# Patient Record
Sex: Male | Born: 1990 | Race: Black or African American | Hispanic: No | Marital: Married | Smoking: Former smoker
Health system: Southern US, Community
[De-identification: ages and names within clinical notes are randomized; demographics above are authoritative.]

## PROBLEM LIST (undated history)

## (undated) DIAGNOSIS — G43019 Migraine without aura, intractable, without status migrainosus: Secondary | ICD-10-CM

## (undated) DIAGNOSIS — I479 Paroxysmal tachycardia, unspecified: Secondary | ICD-10-CM

## (undated) DIAGNOSIS — G90A Postural orthostatic tachycardia syndrome (POTS): Secondary | ICD-10-CM

## (undated) DIAGNOSIS — F410 Panic disorder [episodic paroxysmal anxiety] without agoraphobia: Secondary | ICD-10-CM

## (undated) DIAGNOSIS — B019 Varicella without complication: Secondary | ICD-10-CM

## (undated) DIAGNOSIS — Z9109 Other allergy status, other than to drugs and biological substances: Secondary | ICD-10-CM

## (undated) HISTORY — DX: Migraine without aura, intractable, without status migrainosus: G43.019

## (undated) HISTORY — DX: Varicella without complication: B01.9

## (undated) HISTORY — DX: Paroxysmal tachycardia, unspecified: I47.9

## (undated) HISTORY — PX: NO PAST SURGERIES: SHX2092

## (undated) HISTORY — DX: Other allergy status, other than to drugs and biological substances: Z91.09

---

## 2016-01-29 ENCOUNTER — Emergency Department (HOSPITAL_COMMUNITY)
Admission: EM | Admit: 2016-01-29 | Discharge: 2016-01-30 | Disposition: A | Discharge status: Home / Self Care | Payer: BLUE CROSS/BLUE SHIELD | Attending: Emergency Medicine | Admitting: Emergency Medicine

## 2016-01-29 ENCOUNTER — Encounter (HOSPITAL_COMMUNITY): Payer: Self-pay

## 2016-01-29 DIAGNOSIS — R519 Headache, unspecified: Secondary | ICD-10-CM

## 2016-01-29 DIAGNOSIS — M542 Cervicalgia: Secondary | ICD-10-CM | POA: Insufficient documentation

## 2016-01-29 DIAGNOSIS — R51 Headache: Secondary | ICD-10-CM | POA: Diagnosis present

## 2016-01-29 DIAGNOSIS — R070 Pain in throat: Secondary | ICD-10-CM | POA: Diagnosis not present

## 2016-01-29 DIAGNOSIS — Z87891 Personal history of nicotine dependence: Secondary | ICD-10-CM | POA: Diagnosis not present

## 2016-01-29 NOTE — ED Notes (Signed)
Pt started getting a headache while at work about an hour ago and was having neck pain before the headache started. Also started having a stinging sensation to his left arm after the headache started. Denies any sensitivity to light or sounds and denies N/V

## 2016-01-29 NOTE — ED Provider Notes (Signed)
CSN: [id number]     Arrival date & time 01/29/16  2028 History   First MD Initiated Contact with Patient 01/29/16 2346     Chief Complaint  Patient presents with  . Headache  . Neck Pain     (Consider location/radiation/quality/duration/timing/severity/associated sxs/prior Treatment) HPI Comments: Is a 25 year old male who states for the past several months he's had left sided anterior neck discomfort, just below the angle of jaw, along with intermittent right-sided headaches.  He states the right side of his face became numb several months ago.  He feels there is pressure behind his right eye.  Denies any visual changes, sore throat, fever, congestion.  He took ibuprofen personally 5 hours ago with no relief of his symptoms  Patient is a 25 y.o. male presenting with headaches and neck pain. The history is provided by the patient.  Headache Pain location:  R temporal Quality:  Dull Radiates to:  Does not radiate Severity currently:  5/10 Onset quality:  Gradual Timing:  Intermittent Chronicity:  Recurrent Similar to prior headaches: yes   Relieved by:  Nothing Worsened by:  Nothing Ineffective treatments:  NSAIDs Associated symptoms: neck pain   Associated symptoms: no dizziness, no fever and no sore throat   Neck Pain Associated symptoms: headaches   Associated symptoms: no fever     History reviewed. No pertinent past medical history. History reviewed. No pertinent past surgical history. No family history on file. Social History  Substance Use Topics  . Smoking status: Former Smoker    Quit date: 05/29/2015  . Smokeless tobacco: None  . Alcohol Use: No    Review of Systems  Constitutional: Negative for fever and chills.  HENT: Negative for sore throat and trouble swallowing.   Eyes: Negative for visual disturbance.  Musculoskeletal: Positive for neck pain.  Neurological: Positive for headaches. Negative for dizziness.  All other systems reviewed and are  negative.     Allergies  Review of patient's allergies indicates no known allergies.  Home Medications   Prior to Admission medications   Not on File   BP 120/71 mmHg  Pulse 64  Temp(Src) 98 F (36.7 C) (Oral)  Resp 18  Ht 6' (1.829 m)  Wt 73.483 kg  BMI 21.97 kg/m2  SpO2 100% Physical Exam  Constitutional: He is oriented to person, place, and time. He appears well-developed and well-nourished.  HENT:  Head: Normocephalic.  Mouth/Throat: No oropharyngeal exudate.  Eyes: Pupils are equal, round, and reactive to light.  Neck: Normal range of motion.  Cardiovascular: Normal rate and regular rhythm.   Pulmonary/Chest: Effort normal and breath sounds normal.  Lymphadenopathy:    He has no cervical adenopathy.  Neurological: He is alert and oriented to person, place, and time. No cranial nerve deficit. Coordination normal.  Skin: Skin is warm and dry.  Nursing note and vitals reviewed.   ED Course  Procedures (including critical care time) Labs Review Labs Reviewed - No data to display  Imaging Review No results found. I have personally reviewed and evaluated these images and lab results as part of my medical decision-making.   EKG Interpretation None     Patient's physical examination is normal without any physical findings to support patient's complaints of facial numbness and throat pain.  He is been given a referral to ENT for further evaluation MDM   Final diagnoses:  Nonintractable headache, unspecified chronicity pattern, unspecified headache type  Throat pain in adult        Earley Favor,  NP 01/30/16 1610  Tomasita Crumble, MD 01/30/16 0530

## 2016-01-30 MED ORDER — KETOROLAC TROMETHAMINE 30 MG/ML IJ SOLN
30.0000 mg | Freq: Once | INTRAMUSCULAR | Status: AC
  Administered 2016-01-30: 30 mg via INTRAMUSCULAR
  Filled 2016-01-30: qty 1

## 2016-01-30 NOTE — Discharge Instructions (Signed)
General Headache Without Cause A headache is pain or discomfort felt around the head or neck area. There are many causes and types of headaches. In some cases, the cause may not be found.  HOME CARE  Managing Pain  Take over-the-counter and prescription medicines only as told by your doctor.  Lie down in a dark, quiet room when you have a headache.  If directed, apply ice to the head and neck area:  Put ice in a plastic bag.  Place a towel between your skin and the bag.  Leave the ice on for 20 minutes, 2-3 times per day.  Use a heating pad or hot shower to apply heat to the head and neck area as told by your doctor.  Keep lights dim if bright lights bother you or make your headaches worse. Eating and Drinking  Eat meals on a regular schedule.  Lessen how much alcohol you drink.  Lessen how much caffeine you drink, or stop drinking caffeine. General Instructions  Keep all follow-up visits as told by your doctor. This is important.  Keep a journal to find out if certain things bring on headaches. For example, write down:  What you eat and drink.  How much sleep you get.  Any change to your diet or medicines.  Relax by getting a massage or doing other relaxing activities.  Lessen stress.  Sit up straight. Do not tighten (tense) your muscles.  Do not use tobacco products. This includes cigarettes, chewing tobacco, or e-cigarettes. If you need help quitting, ask your doctor.  Exercise regularly as told by your doctor.  Get enough sleep. This often means 7-9 hours of sleep. GET HELP IF:  Your symptoms are not helped by medicine.  You have a headache that feels different than the other headaches.  You feel sick to your stomach (nauseous) or you throw up (vomit).  You have a fever. GET HELP RIGHT AWAY IF:   Your headache becomes really bad.  You keep throwing up.  You have a stiff neck.  You have trouble seeing.  You have trouble speaking.  You have  pain in the eye or ear.  Your muscles are weak or you lose muscle control.  You lose your balance or have trouble walking.  You feel like you will pass out (faint) or you pass out.  You have confusion.   This information is not intended to replace advice given to you by your health care provider. Make sure you discuss any questions you have with your health care provider.   Document Released: 09/22/2008 Document Revised: 09/04/2015 Document Reviewed: 04/08/2015 Elsevier Interactive Patient Education [employer] Your physical exam is normal.  You have been given a referral to ENT for further evaluation of your headache, throat pain and facial numbness.  Please call and make an appointment for the next available appointment

## 2016-07-26 ENCOUNTER — Emergency Department (HOSPITAL_COMMUNITY)
Admission: EM | Admit: 2016-07-26 | Discharge: 2016-07-27 | Disposition: A | Discharge status: Home / Self Care | Payer: BLUE CROSS/BLUE SHIELD | Attending: Emergency Medicine | Admitting: Emergency Medicine

## 2016-07-26 ENCOUNTER — Encounter (HOSPITAL_COMMUNITY): Payer: Self-pay | Admitting: Emergency Medicine

## 2016-07-26 ENCOUNTER — Emergency Department (HOSPITAL_COMMUNITY): Payer: BLUE CROSS/BLUE SHIELD

## 2016-07-26 DIAGNOSIS — Z87891 Personal history of nicotine dependence: Secondary | ICD-10-CM | POA: Diagnosis not present

## 2016-07-26 DIAGNOSIS — I313 Pericardial effusion (noninflammatory): Secondary | ICD-10-CM | POA: Diagnosis not present

## 2016-07-26 DIAGNOSIS — R1031 Right lower quadrant pain: Secondary | ICD-10-CM | POA: Diagnosis not present

## 2016-07-26 DIAGNOSIS — I3139 Other pericardial effusion (noninflammatory): Secondary | ICD-10-CM

## 2016-07-26 DIAGNOSIS — R109 Unspecified abdominal pain: Secondary | ICD-10-CM

## 2016-07-26 DIAGNOSIS — R110 Nausea: Secondary | ICD-10-CM | POA: Insufficient documentation

## 2016-07-26 DIAGNOSIS — R1013 Epigastric pain: Secondary | ICD-10-CM | POA: Diagnosis present

## 2016-07-26 HISTORY — DX: Panic disorder (episodic paroxysmal anxiety): F41.0

## 2016-07-26 LAB — URINALYSIS, ROUTINE W REFLEX MICROSCOPIC
Glucose, UA: NEGATIVE mg/dL
HGB URINE DIPSTICK: NEGATIVE
KETONES UR: 40 mg/dL — AB
LEUKOCYTES UA: NEGATIVE
Nitrite: NEGATIVE
PROTEIN: NEGATIVE mg/dL
Specific Gravity, Urine: 1.031 — ABNORMAL HIGH (ref 1.005–1.030)
pH: 6 (ref 5.0–8.0)

## 2016-07-26 LAB — COMPREHENSIVE METABOLIC PANEL
ALT: 20 U/L (ref 17–63)
AST: 24 U/L (ref 15–41)
Albumin: 4.8 g/dL (ref 3.5–5.0)
Alkaline Phosphatase: 47 U/L (ref 38–126)
Anion gap: 10 (ref 5–15)
BILIRUBIN TOTAL: 0.9 mg/dL (ref 0.3–1.2)
BUN: 16 mg/dL (ref 6–20)
CALCIUM: 9.7 mg/dL (ref 8.9–10.3)
CO2: 22 mmol/L (ref 22–32)
CREATININE: 1.3 mg/dL — AB (ref 0.61–1.24)
Chloride: 105 mmol/L (ref 101–111)
Glucose, Bld: 103 mg/dL — ABNORMAL HIGH (ref 65–99)
Potassium: 3.7 mmol/L (ref 3.5–5.1)
Sodium: 137 mmol/L (ref 135–145)
Total Protein: 7.7 g/dL (ref 6.5–8.1)

## 2016-07-26 LAB — CBC
HCT: 44.4 % (ref 39.0–52.0)
Hemoglobin: 15.4 g/dL (ref 13.0–17.0)
MCH: 32.2 pg (ref 26.0–34.0)
MCHC: 34.7 g/dL (ref 30.0–36.0)
MCV: 92.7 fL (ref 78.0–100.0)
PLATELETS: 204 10*3/uL (ref 150–400)
RBC: 4.79 MIL/uL (ref 4.22–5.81)
RDW: 12.9 % (ref 11.5–15.5)
WBC: 4.5 10*3/uL (ref 4.0–10.5)

## 2016-07-26 LAB — LIPASE, BLOOD: LIPASE: 25 U/L (ref 11–51)

## 2016-07-26 NOTE — ED Provider Notes (Signed)
Emergency Department Provider Note   I have reviewed the triage vital signs and the nursing notes.   HISTORY  Chief Complaint Abdominal Pain   HPI Juan Barnes is a 25 y.o. male with PMH of anxiety presents to the ED for evaluation of epigastric and right flank pain since Thursday. He reports some associated diarrhea but denies nausea and vomiting. No prior abdominal surgical history. He has had two urgent care visits since pain onset with no clear diagnosis. The UC today performed a plain film of the abdomen and referred to the ED for further evaluation. He denies fever or chills. No testicular pain or swelling. No change with food intake.   Past Medical History:  Diagnosis Date  . Anxiety attack     There are no active problems to display for this patient.   History reviewed. No pertinent surgical history.  Current Outpatient Rx  . Order #: [id number] Class: Historical Med  . Order #: [id number] Class: Historical Med  . Order #: [id number] Class: Historical Med  . Order #: [id number] Class: Print  . Order #: [id number] Class: Print    Allergies Review of patient's allergies indicates no known allergies.  No family history on file.  Social History Social History  Substance Use Topics  . Smoking status: Former Smoker    Quit date: 05/29/2015  . Smokeless tobacco: Not on file  . Alcohol use No    Review of Systems  Constitutional: No fever/chills Eyes: No visual changes. ENT: No sore throat. Cardiovascular: Denies chest pain. Respiratory: Denies shortness of breath. Gastrointestinal: Epigastric abdominal pain. No nausea, no vomiting.  No diarrhea.  No constipation. Genitourinary: Negative for dysuria. Positive right flank pain. Musculoskeletal: Negative for back pain. Skin: Negative for rash. Neurological: Negative for headaches, focal weakness or numbness.  10-point ROS otherwise negative.  ____________________________________________   PHYSICAL  EXAM:  VITAL SIGNS: ED Triage Vitals  Enc Vitals Group     BP 07/26/16 1939 136/74     Pulse Rate 07/26/16 1939 96     Resp 07/26/16 1939 16     Temp 07/26/16 1939 98.1 F (36.7 C)     Temp Source 07/26/16 1939 Oral     SpO2 07/26/16 1939 100 %     Weight 07/26/16 1939 169 lb (76.7 kg)     Height 07/26/16 1939 6' (1.829 m)     Pain Score 07/26/16 1940 8   Constitutional: Alert and oriented. Well appearing and in no acute distress. Eyes: Conjunctivae are normal. PERRL. EOMI. Head: Atraumatic. Nose: No congestion/rhinnorhea. Mouth/Throat: Mucous membranes are moist.  Oropharynx non-erythematous. Neck: No stridor.  Cardiovascular: Normal rate, regular rhythm. Good peripheral circulation. Grossly normal heart sounds.   Respiratory: Normal respiratory effort.  No retractions. Lungs CTAB. Gastrointestinal: Soft and nontender. Positive tenderness to percussion of the right flank. No distention.  Genitourinary: Normal caliber testicle. No tenderness or masses. Normal cremasteric reflex.  Musculoskeletal: No lower extremity tenderness nor edema. No gross deformities of extremities. Neurologic:  Normal speech and language. No gross focal neurologic deficits are appreciated.  Skin:  Skin is warm, dry and intact. No rash noted. Psychiatric: Mood and affect are normal. Speech and behavior are normal.  ____________________________________________   LABS (all labs ordered are listed, but only abnormal results are displayed)  Labs Reviewed  COMPREHENSIVE METABOLIC PANEL - Abnormal; Notable for the following:       Result Value   Glucose, Bld 103 (*)    Creatinine, Ser 1.30 (*)    All other components  within normal limits  URINALYSIS, ROUTINE W REFLEX MICROSCOPIC (NOT AT [facility]) - Abnormal; Notable for the following:    Specific Gravity, Urine 1.031 (*)    Bilirubin Urine SMALL (*)    Ketones, ur 40 (*)    All other components within normal limits  LIPASE, BLOOD  CBC    ____________________________________________  RADIOLOGY  Ct Renal Stone Study  Result Date: 07/26/2016 CLINICAL DATA:  25 year old male with right flank pain EXAM: CT ABDOMEN AND PELVIS WITHOUT CONTRAST TECHNIQUE: Multidetector CT imaging of the abdomen and pelvis was performed following the standard protocol without IV contrast. COMPARISON:  None. FINDINGS: Evaluation of this exam is limited in the absence of intravenous contrast. The visualized lung bases are clear. Partially visualized pericardial effusion. Correlation with clinical exam and echocardiogram recommended. No intra-abdominal free air or free fluid. The liver, pancreas, spleen, and the adrenal glands appear unremarkable with small amount of layering sludge may be present within the gallbladder. No pericholecystic fluid. The kidneys, visualized ureters, and urinary bladder appear unremarkable. The prostate and seminal vesicles are grossly unremarkable. There is moderate stool throughout the colon. No evidence of bowel obstruction or active inflammation. Normal appendix. The abdominal aorta and IVC are grossly unremarkable on this noncontrast study. No portal venous gas identified. There is no adenopathy. The abdominal wall soft tissues appear unremarkable. The osseous structures are intact. IMPRESSION: No acute intra-abdominal or pelvic pathology. Specifically there is no hydronephrosis or nephrolithiasis. Partially visualized pericardial effusion. Correlation with clinical exam and echocardiogram recommended. Electronically Signed   By: Elgie Collard M.D.   On: 07/26/2016 23:57   ____________________________________________   PROCEDURES  Procedure(s) performed:   Procedures  None ____________________________________________   INITIAL IMPRESSION / ASSESSMENT AND PLAN / ED COURSE  Pertinent labs & imaging results that were available during my care of the patient were reviewed by me and considered in my medical decision  making (see chart for details).  Patient resents to the emergency department for evaluation of right flank and epigastric discomfort with intermittent radiation of pain to the groin. He has absolutely no tenderness to palpation of his anterior abdomen including the right lower quadrant. Extremely low suspicion for appendicitis in this patient based on exam and lab work. Very low suspicion for testicular torsion based on exam and history. No history of kidney stone but given persistent pain symptoms plan for noncontrast CT to further evaluate.   12:09 PM CT scan resulted. No intra-abdominal pathology. Patient is noted to have a pericardial effusion on partial view of the lower heart border with CT. Patient is having no chest pain or dyspnea. Vital signs are unremarkable. Low suspicion clinically is causing his pain with primarily right flank pain radiating to the groin. Discussed the need to call his primary care physician in the morning to schedule an outpatient echocardiogram on an urgent basis.  Given his current clinical picture and normal vital signs and do not feel he needs an emergent study tonight or admission for further evaluation. Patient will call his PCP in the morning and return with any warning signs or symptoms that we discussed in detail.  ____________________________________________  FINAL CLINICAL IMPRESSION(S) / ED DIAGNOSES  Final diagnoses:  Right lower quadrant abdominal pain  Nausea  Pericardial effusion     MEDICATIONS GIVEN DURING THIS VISIT:  None  NEW OUTPATIENT MEDICATIONS STARTED DURING THIS VISIT:  Discharge Medication List as of 07/27/2016 12:13 AM    START taking these medications   Details  dicyclomine (BENTYL) 20  MG tablet Take 1 tablet (20 mg total) by mouth 2 (two) times daily., Starting Mon 07/27/2016, Print    ondansetron (ZOFRAN) 4 MG tablet Take 1 tablet (4 mg total) by mouth every 8 (eight) hours as needed for nausea or vomiting., Starting Mon  07/27/2016, Print        Note:  This document was prepared using Dragon voice recognition software and may include unintentional dictation errors.  Alona Bene, MD Emergency Medicine   Maia Plan, MD 07/27/16 [id number]

## 2016-07-26 NOTE — ED Notes (Signed)
Patient reports RLQ abd pn starting Thursday; went to urgent care who referred him here.  Pain refers to right flank and radiates to groin.  Mild diarrhea today.

## 2016-07-26 NOTE — ED Notes (Signed)
Nurse called to room.  Patient now c/o localized R testicle pain.

## 2016-07-26 NOTE — ED Triage Notes (Signed)
Pt. reports right abdominal pain with mild nausea and diarrhea onset Thursday , seen at an urgent care clinic today advised to go to ER due to appendicitis .

## 2016-07-27 MED ORDER — DICYCLOMINE HCL 20 MG PO TABS: 20.0000 mg | ORAL_TABLET | Freq: Two times a day (BID) | ORAL | 0 refills | Status: DC

## 2016-07-27 MED ORDER — ONDANSETRON HCL 4 MG PO TABS: 4.0000 mg | ORAL_TABLET | Freq: Three times a day (TID) | ORAL | 0 refills | Status: DC | PRN

## 2016-07-27 NOTE — Discharge Instructions (Signed)
You have been seen in the Emergency Department (ED) for abdominal pain.  Your evaluation did not identify a clear cause of your symptoms but was generally reassuring.  We did find some fluid around the heart but no sign that this is causing you any immediate problems. You dod need to contact your PCP later today and schedule an outpatient ECHO cardiogram for better characterization of the fluid.   Please follow up as instructed above regarding today?s emergent visit and the symptoms that are bothering you.  Return to the ED if your abdominal pain worsens or fails to improve, you develop bloody vomiting, bloody diarrhea, you are unable to tolerate fluids due to vomiting, fever greater than 101, or other symptoms that concern you.

## 2016-07-27 NOTE — ED Notes (Signed)
Pt stable, ambulatory, states understanding of discharge instructions 

## 2016-07-28 ENCOUNTER — Encounter: Payer: Self-pay | Admitting: Family Medicine

## 2016-07-28 ENCOUNTER — Ambulatory Visit (INDEPENDENT_AMBULATORY_CARE_PROVIDER_SITE_OTHER): Payer: BLUE CROSS/BLUE SHIELD | Admitting: Family Medicine

## 2016-07-28 VITALS — BP 114/77 | HR 74 | Temp 98.2°F | Resp 20 | Ht 72.0 in | Wt 169.8 lb

## 2016-07-28 DIAGNOSIS — I479 Paroxysmal tachycardia, unspecified: Secondary | ICD-10-CM | POA: Diagnosis not present

## 2016-07-28 DIAGNOSIS — I319 Disease of pericardium, unspecified: Secondary | ICD-10-CM | POA: Diagnosis not present

## 2016-07-28 DIAGNOSIS — I3139 Other pericardial effusion (noninflammatory): Secondary | ICD-10-CM

## 2016-07-28 DIAGNOSIS — F411 Generalized anxiety disorder: Secondary | ICD-10-CM | POA: Diagnosis not present

## 2016-07-28 DIAGNOSIS — R748 Abnormal levels of other serum enzymes: Secondary | ICD-10-CM | POA: Diagnosis not present

## 2016-07-28 DIAGNOSIS — I313 Pericardial effusion (noninflammatory): Secondary | ICD-10-CM | POA: Insufficient documentation

## 2016-07-28 DIAGNOSIS — R7989 Other specified abnormal findings of blood chemistry: Secondary | ICD-10-CM

## 2016-07-28 NOTE — Patient Instructions (Signed)
Pericardial Effusion °Pericardial effusion is a buildup of fluid around your heart. The heart is surrounded by a double-layered sac (pericardium). This sac normally contains a small amount of fluid. When too much builds up, it can put pressure on your heart and cause problems. As fluid builds up in the pericardial sac and pressure on your heart increases, it becomes harder for your heart to pump blood. When fluid prevents your heart from pumping enough blood, it is called cardiac tamponade. Cardiac tamponade is a life-threatening condition. °CAUSES  °Often the cause of pericardial effusion is not known (idiopathic effusion). Possible causes are from: °· Infections, such as from a virus, bacteria, fungus, or parasite. °· Damage to the pericardium from heart surgery or a heart attack. °· Inflammatory diseases, such as rheumatoid arthritis or lupus. °· Kidney disease. °· Thyroid disease. °· Cancer. °· Cancer treatment, including radiation or chemotherapy. °· Certain drugs, including tuberculosis drugs or seizure drugs. °· Chest trauma. °SIGNS AND SYMPTOMS  °Pericardial effusion may not cause symptoms at first, especially if the fluid builds up slowly. In time, pressure on the heart may cause: °· Chest pain. °· Trouble breathing. °· Pain and shortness of breath that is worse when lying down. °· Dizziness. °· Fainting. °· Cough. °· Hiccups. °· Skipped heartbeats (palpitations). °· Anxiety and confusion. °· A bluish skin color (cyanosis). °· Swollen legs and ankles. °DIAGNOSIS  °Your health care provider may suspect pericardial effusion based on your symptoms. Your health care provider may also do a physical exam to check for: °· Low blood pressure and weak pulses. °· Soft (muffled) heart sounds. °· Rapid heartbeat. °· Full veins in your neck (distended jugular veins). °· Decreased breathing sounds and a rubbing sound (friction rub) when listening to your lungs. °Your health care provider may also do several tests to  confirm the diagnosis and find out what is causing the pericardial effusion. These may include: °· Chest X-ray. °· Imaging study of the heart using sound waves (echocardiogram). °· CT scan or MRI. °· Electrical study of the heart (electrocardiogram [ECG]). °· A procedure using a needle to remove fluid from the pericardium for examination (pericardiocentesis). °· Blood tests to check for: °¨ Infection. °¨ Heart damage. °¨ Thyroid abnormalities. °¨ Kidney disease. °¨ Inflammatory disorders. °TREATMENT  °Treatment for pericardial effusion depends on the cause of your symptoms and how severe your symptoms are. If a specific cause was found, that cause will be treated. Treatment may include: °· Medicines, such as: °¨ Nonsteroidal anti-inflammatory drugs (NSAIDs). °¨ Other anti-inflammatory drugs, such as steroids. °¨ Antibiotic medicine. °¨ Antifungal medicine. °· Hospital treatment may be necessary, such as for cardiac tamponade. This may include: °¨ Intravenous (IV) fluids. °¨ Breathing support. °· Surgery may be needed in severe cases. Surgery may include: °¨ Pericardiocentesis. °¨ Open heart surgery. °¨ A procedure to make a permanent opening in the pericardium (pericardial window). °SEEK MEDICAL CARE IF: °· You feel dizzy or light-headed. °· You have swelling in your legs or ankles. °· You have heart palpitations. °· You have persistent cough or hiccups. °SEEK IMMEDIATE MEDICAL CARE IF:  °· You faint. °· You have chest pain. °· You have trouble breathing. °These symptoms may represent a serious problem that is an emergency. Do not wait to see if the symptoms will go away. Get medical help right away. Call your local emergency services (911 in the [place] Do not drive yourself to the hospital. °MAKE SURE YOU: °· Understand these instructions. °· Will watch your condition. °·   Will get help right away if you are not doing well or get worse. °  °This information is not intended to replace advice given to you by your  health care provider. Make sure you discuss any questions you have with your health care provider. °  °Document Released: 08/11/2005 Document Revised: 01/04/2015 Document Reviewed: 05/03/2014 °Elsevier Interactive Patient Education ©2016 [employer] ° °

## 2016-07-28 NOTE — Progress Notes (Signed)
Patient ID: Juan Barnes, male  DOB: [birth year], 25 y.o.   MRN: [id number] Patient Care Team    Relationship Specialty Notifications Start End  Ma Hillock, DO PCP - General Family Medicine  07/28/16     Subjective:  Juan Barnes is a 25 y.o.  male present for new patient establishment with recent ED visit.  All past medical history, surgical history, allergies, family history, immunizations, medications and social history were obtained/entered in the electronic medical record today. All recent labs, ED visits and hospitalizations within the last year were reviewed.  Pericardial effusion: Pt presented to ED 2 days ago with epigastric Right quadrant pain. CBC, CMP and lipase WNL, with the exception of mildly elevated creatinine and ketones in his urine.  CT scan of abd was completed with pericardial effusion present (partial view only). Patient has a history of anxiety and tachycardia. He was seen last year at outside facility for tachycardia and referred to cardiology, which he did not end up going because all symptoms resolved with anxiety medication. He denies chest pain, shortness breath, fatigue, night sweats. He has some weight loss after GI symptoms. He plays tennis a few times a week without difficulties.   Health maintenance:  Colonoscopy: No Fhx, screen 50 Immunizations:unknown  Infectious disease screening: HIV unknown.  PSA: No results found for: PSA. FHX in father at 62.   There is no immunization history on file for this patient.   Past Medical History:  Diagnosis Date  . Anxiety attack   . Chicken pox   . Pollen allergy   . Tachycardia, paroxysmal (HCC)    Allergies  Allergen Reactions  . Banana Itching    Itchy/tight throat.    History reviewed. No pertinent surgical history. Family History  Problem Relation Age of Onset  . Prostate cancer Father   . Post-traumatic stress disorder Father    Social History   Social History  . Marital status: Single      Spouse name: N/A  . Number of children: N/A  . Years of education: N/A   Occupational History  . Not on file.   Social History Main Topics  . Smoking status: Former Smoker    Types: Cigarettes    Quit date: 05/29/2015  . Smokeless tobacco: Never Used  . Alcohol use No  . Drug use:     Types: Marijuana  . Sexual activity: Yes    Partners: Female    Birth control/ protection: None   Other Topics Concern  . Not on file   Social History Narrative   Engaged to [place]. ( 1 child Camilla).    Some college. Distribution Specialist.    Takes a daily vitamin.    Wears seatbelt   Exercise routinely.    Smoke detector in the home.    Firearms (locked) in the home.    Feels safe in relationships.      Medication List       Accurate as of 07/28/16  3:14 PM. Always use your most recent med list.          clonazePAM 0.5 MG tablet Commonly known as:  KLONOPIN Take 0.5 mg by mouth 3 (three) times daily as needed for anxiety.   dicyclomine 20 MG tablet Commonly known as:  BENTYL Take 1 tablet (20 mg total) by mouth 2 (two) times daily.   esomeprazole 40 MG capsule Commonly known as:  NEXIUM Take 40 mg by mouth daily at 12 noon.  MAALOX ADVANCED 200-200-20 MG/5ML suspension Generic drug:  alum & mag hydroxide-simeth Take 10 mLs by mouth 2 (two) times daily as needed for indigestion or heartburn.   ondansetron 4 MG tablet Commonly known as:  ZOFRAN Take 1 tablet (4 mg total) by mouth every 8 (eight) hours as needed for nausea or vomiting.        Recent Results (from the past 2160 hour(s))  Urinalysis, Routine w reflex microscopic     Status: Abnormal   Collection Time: 07/26/16  1:41 PM  Result Value Ref Range   Color, Urine YELLOW YELLOW   APPearance CLEAR CLEAR   Specific Gravity, Urine 1.031 (H) 1.005 - 1.030   pH 6.0 5.0 - 8.0   Glucose, UA NEGATIVE NEGATIVE mg/dL   Hgb urine dipstick NEGATIVE NEGATIVE   Bilirubin Urine SMALL (A) NEGATIVE   Ketones, ur 40  (A) NEGATIVE mg/dL   Protein, ur NEGATIVE NEGATIVE mg/dL   Nitrite NEGATIVE NEGATIVE   Leukocytes, UA NEGATIVE NEGATIVE    Comment: MICROSCOPIC NOT DONE ON URINES WITH NEGATIVE PROTEIN, BLOOD, LEUKOCYTES, NITRITE, OR GLUCOSE <1000 mg/dL.  Lipase, blood     Status: None   Collection Time: 07/26/16  7:47 PM  Result Value Ref Range   Lipase 25 11 - 51 U/L  Comprehensive metabolic panel     Status: Abnormal   Collection Time: 07/26/16  7:47 PM  Result Value Ref Range   Sodium 137 135 - 145 mmol/L   Potassium 3.7 3.5 - 5.1 mmol/L   Chloride 105 101 - 111 mmol/L   CO2 22 22 - 32 mmol/L   Glucose, Bld 103 (H) 65 - 99 mg/dL   BUN 16 6 - 20 mg/dL   Creatinine, Ser 1.30 (H) 0.61 - 1.24 mg/dL   Calcium 9.7 8.9 - 10.3 mg/dL   Total Protein 7.7 6.5 - 8.1 g/dL   Albumin 4.8 3.5 - 5.0 g/dL   AST 24 15 - 41 U/L   ALT 20 17 - 63 U/L   Alkaline Phosphatase 47 38 - 126 U/L   Total Bilirubin 0.9 0.3 - 1.2 mg/dL   GFR calc non Af Amer >60 >60 mL/min   GFR calc Af Amer >60 >60 mL/min    Comment: (NOTE) The eGFR has been calculated using the CKD EPI equation. This calculation has not been validated in all clinical situations. eGFR's persistently <60 mL/min signify possible Chronic Kidney Disease.    Anion gap 10 5 - 15  CBC     Status: None   Collection Time: 07/26/16  7:47 PM  Result Value Ref Range   WBC 4.5 4.0 - 10.5 K/uL   RBC 4.79 4.22 - 5.81 MIL/uL   Hemoglobin 15.4 13.0 - 17.0 g/dL   HCT 44.4 39.0 - 52.0 %   MCV 92.7 78.0 - 100.0 fL   MCH 32.2 26.0 - 34.0 pg   MCHC 34.7 30.0 - 36.0 g/dL   RDW 12.9 11.5 - 15.5 %   Platelets 204 150 - 400 K/uL    Ct Renal Stone Study  Result Date: 07/26/2016 CLINICAL DATA:  25 year old male with right flank pain EXAM: CT ABDOMEN AND PELVIS WITHOUT CONTRAST TECHNIQUE: Multidetector CT imaging of the abdomen and pelvis was performed following the standard protocol without IV contrast. COMPARISON:  None. FINDINGS: Evaluation of this exam is limited in  the absence of intravenous contrast. The visualized lung bases are clear. Partially visualized pericardial effusion. Correlation with clinical exam and echocardiogram recommended. No intra-abdominal free air  or free fluid. The liver, pancreas, spleen, and the adrenal glands appear unremarkable with small amount of layering sludge may be present within the gallbladder. No pericholecystic fluid. The kidneys, visualized ureters, and urinary bladder appear unremarkable. The prostate and seminal vesicles are grossly unremarkable. There is moderate stool throughout the colon. No evidence of bowel obstruction or active inflammation. Normal appendix. The abdominal aorta and IVC are grossly unremarkable on this noncontrast study. No portal venous gas identified. There is no adenopathy. The abdominal wall soft tissues appear unremarkable. The osseous structures are intact. IMPRESSION: No acute intra-abdominal or pelvic pathology. Specifically there is no hydronephrosis or nephrolithiasis. Partially visualized pericardial effusion. Correlation with clinical exam and echocardiogram recommended. Electronically Signed   By: Anner Crete M.D.   On: 07/26/2016 23:57  ROS: 14 pt review of systems performed and negative (unless mentioned in an HPI)  Objective: BP 114/77 (BP Location: Left Arm, Patient Position: Sitting, Cuff Size: Large)   Pulse 74   Temp 98.2 F (36.8 C) (Oral)   Resp 20   Ht 6' (1.829 m)   Wt 169 lb 12 oz (77 kg)   SpO2 100%   BMI 23.02 kg/m  Gen: Afebrile. No acute distress. Nontoxic in appearance, well-developed, well-nourished,  Pleasant AAM. HENT: AT. [place].  MMM, no oral lesions.  no Cough on exam, no hoarseness on exam. Eyes:Pupils Equal Round Reactive to light, Extraocular movements intact,  Conjunctiva without redness, discharge or icterus. Neck/lymp/endocrine: Supple,no lymphadenopathy, no thyromegaly CV: RRR no murmur, no edema, +2/4 P posterior tibialis pulses. No TTP chest wall.   Chest: CTAB, no wheeze, rhonchi or crackles. normal Respiratory effort. good Air movement. Abd: Soft. flat. NTND. BS present. No  Masses palpated. No hepatosplenomegaly. No rebound tenderness or guarding. Skin:Warm and well-perfused. Skin intact. Neuro/Msk: Normal gait. PERLA. EOMi. Alert. Oriented x3.  Psych: Normal affect, dress and demeanor. Normal speech. Normal thought content and judgment. EKG: HR 69, PR 158, Qtc 379, repolarization variant. Otherwise no ST or T wave changes.   Assessment/plan: Juan Barnes is a 25 y.o. male present for new  Pt establishment, ED follow up.  Pericardial effusion/Tachycardia, paroxysmal (HCC) - EKG 12-Lead (above) - ECHOCARDIOGRAM COMPLETE; Future - TSH - Sed Rate (ESR) - C-reactive protein  Generalized anxiety disorder Currently prescribed Klonopin. Does well on 0.5 mg BID PRN.  - No refills today.    Elevated serum creatinine - BASIC METABOLIC PANEL WITH GFR - mildly elevated in the ED. Repeat today.    Greater than 45 minutes was spent with patient, greater than 50% of that time was spent face-to-face with patient counseling and coordinating care.   CPE within 3 months.    Electronically signed by: Howard Pouch, DO [place]

## 2016-07-29 ENCOUNTER — Telehealth: Payer: Self-pay | Admitting: Family Medicine

## 2016-07-29 LAB — BASIC METABOLIC PANEL WITH GFR
BUN: 22 mg/dL (ref 7–25)
CALCIUM: 9.8 mg/dL (ref 8.6–10.3)
CO2: 26 mmol/L (ref 20–31)
Chloride: 102 mmol/L (ref 98–110)
Creat: 1.28 mg/dL (ref 0.60–1.35)
GFR, EST AFRICAN AMERICAN: 88 mL/min (ref >=60)
GFR, Est Non African American: 76 mL/min (ref >=60)
GLUCOSE: 89 mg/dL (ref 65–99)
POTASSIUM: 4.6 mmol/L (ref 3.5–5.3)
Sodium: 139 mmol/L (ref 135–146)

## 2016-07-29 LAB — TSH: TSH: 1.9 u[IU]/mL (ref 0.35–4.50)

## 2016-07-29 LAB — SEDIMENTATION RATE: Sed Rate: 1 mm/hr (ref 0–15)

## 2016-07-29 LAB — C-REACTIVE PROTEIN: CRP: <0.1 mg/dL — ABNORMAL LOW (ref 0.5–20.0)

## 2016-07-29 NOTE — Telephone Encounter (Signed)
Please call pt: - all of his labs are normal.

## 2016-07-30 ENCOUNTER — Other Ambulatory Visit: Payer: Self-pay

## 2016-07-30 ENCOUNTER — Ambulatory Visit (HOSPITAL_COMMUNITY): Payer: BLUE CROSS/BLUE SHIELD | Attending: Cardiology

## 2016-07-30 DIAGNOSIS — I3139 Other pericardial effusion (noninflammatory): Secondary | ICD-10-CM

## 2016-07-30 DIAGNOSIS — I313 Pericardial effusion (noninflammatory): Secondary | ICD-10-CM | POA: Diagnosis present

## 2016-07-30 DIAGNOSIS — I319 Disease of pericardium, unspecified: Secondary | ICD-10-CM | POA: Diagnosis not present

## 2016-07-30 DIAGNOSIS — I479 Paroxysmal tachycardia, unspecified: Secondary | ICD-10-CM | POA: Insufficient documentation

## 2016-07-30 DIAGNOSIS — Z87891 Personal history of nicotine dependence: Secondary | ICD-10-CM | POA: Insufficient documentation

## 2016-07-30 NOTE — Telephone Encounter (Signed)
Spoke with patient reviewed lab results. 

## 2016-07-31 ENCOUNTER — Telehealth: Payer: Self-pay | Admitting: Family Medicine

## 2016-07-31 DIAGNOSIS — I313 Pericardial effusion (noninflammatory): Secondary | ICD-10-CM

## 2016-07-31 DIAGNOSIS — I3139 Other pericardial effusion (noninflammatory): Secondary | ICD-10-CM

## 2016-07-31 NOTE — Telephone Encounter (Signed)
Please call pt: - his echocardiogram showed a very small "trivial" pericardial effusion (this was completely explained during his visit).  - I have placed an order for CXR ([facility]), and a few additional labs to have completed over the next two weeks at his convenience, with a follow up with this provider 2-3 days after. He should be scheduled for the week of 21 st with provider.Marland Kitchen

## 2016-07-31 NOTE — Telephone Encounter (Signed)
Pt advised and voiced understanding.   

## 2016-08-04 ENCOUNTER — Encounter: Payer: Self-pay | Admitting: Family Medicine

## 2016-08-04 ENCOUNTER — Ambulatory Visit (HOSPITAL_BASED_OUTPATIENT_CLINIC_OR_DEPARTMENT_OTHER)
Admission: RE | Admit: 2016-08-04 | Discharge: 2016-08-04 | Disposition: A | Discharge status: Home / Self Care | Payer: BLUE CROSS/BLUE SHIELD | Source: Ambulatory Visit | Attending: Family Medicine | Admitting: Family Medicine

## 2016-08-04 ENCOUNTER — Ambulatory Visit (INDEPENDENT_AMBULATORY_CARE_PROVIDER_SITE_OTHER): Payer: BLUE CROSS/BLUE SHIELD | Admitting: Family Medicine

## 2016-08-04 VITALS — BP 125/84 | HR 73 | Temp 98.0°F | Resp 20 | Ht 72.0 in | Wt 170.0 lb

## 2016-08-04 DIAGNOSIS — I3139 Other pericardial effusion (noninflammatory): Secondary | ICD-10-CM

## 2016-08-04 DIAGNOSIS — H6123 Impacted cerumen, bilateral: Secondary | ICD-10-CM

## 2016-08-04 DIAGNOSIS — I313 Pericardial effusion (noninflammatory): Secondary | ICD-10-CM

## 2016-08-04 DIAGNOSIS — I319 Disease of pericardium, unspecified: Secondary | ICD-10-CM

## 2016-08-04 DIAGNOSIS — Z0000 Encounter for general adult medical examination without abnormal findings: Secondary | ICD-10-CM | POA: Diagnosis not present

## 2016-08-04 MED ORDER — CARBAMIDE PEROXIDE 6.5 % OT SOLN: 5.0000 [drp] | Freq: Two times a day (BID) | OTIC | 1 refills | Status: DC

## 2016-08-04 NOTE — Progress Notes (Signed)
Patient ID: Juan Barnes, male  DOB: November 08, 1991, 25 y.o.   MRN: [id number] Patient Care Team    Relationship Specialty Notifications Start End  Ma Hillock, DO PCP - General Family Medicine  07/28/16     Subjective:  Juan Barnes is a 25 y.o. male present for CPE. All past medical history, surgical history, allergies, family history, immunizations, medications and social history were updated in the electronic medical record today. All recent labs, ED visits and hospitalizations within the last year were reviewed.  Health maintenance:  Colonoscopy: colon cancer in [place], at age 57. Immunizations:  tdap 2015, influenza yearly encouraged.   Infectious disease screening: HIV completed today PSA: No results found for: PSA, pt was counseled on prostate cancer screenings. Fhx in father, would screen him at 30-35.  Assistive device: None  Oxygen BWL:SLHT  Patient has a Dental home. Hospitalizations/ED visits: reviewed.   Depression screen PHQ 2/9 07/28/2016  Decreased Interest 0  Down, Depressed, Hopeless 0  PHQ - 2 Score 0   Fall Risk  07/28/2016  Falls in the past year? No     Immunization History  Administered Date(s) Administered  . Tdap 06/30/2014     Past Medical History:  Diagnosis Date  . Anxiety attack   . Chicken pox   . Pollen allergy   . Tachycardia, paroxysmal (HCC)    Allergies  Allergen Reactions  . Banana Itching    Itchy/tight throat.    No past surgical history on file. Family History  Problem Relation Age of Onset  . Prostate cancer Father     37  . Post-traumatic stress disorder Father    Social History   Social History  . Marital status: Single    Spouse name: N/A  . Number of children: N/A  . Years of education: N/A   Occupational History  . Not on file.   Social History Main Topics  . Smoking status: Former Smoker    Types: Cigarettes    Quit date: 05/29/2015  . Smokeless tobacco: Never Used  . Alcohol use No  . Drug use:    Types: Marijuana  . Sexual activity: Yes    Partners: Female    Birth control/ protection: None   Other Topics Concern  . Not on file   Social History Narrative   Engaged to Juan Barnes. ( 1 child Juan Barnes).    Some college. Distribution Specialist.    Takes a daily vitamin.    Wears seatbelt   Exercise routinely.    Smoke detector in the home.    Firearms (locked) in the home.    Feels safe in relationships.      Medication List       Accurate as of 08/04/16  2:09 PM. Always use your most recent med list.          carbamide peroxide 6.5 % otic solution Commonly known as:  DEBROX Place 5 drops into both ears 2 (two) times daily.   clonazePAM 0.5 MG tablet Commonly known as:  KLONOPIN Take 0.5 mg by mouth 3 (three) times daily as needed for anxiety.   dicyclomine 20 MG tablet Commonly known as:  BENTYL Take 1 tablet (20 mg total) by mouth 2 (two) times daily.   esomeprazole 40 MG capsule Commonly known as:  NEXIUM Take 40 mg by mouth daily at 12 noon.   MAALOX ADVANCED 200-200-20 MG/5ML suspension Generic drug:  alum & mag hydroxide-simeth Take 10 mLs by mouth 2 (two) times  daily as needed for indigestion or heartburn.   ondansetron 4 MG tablet Commonly known as:  ZOFRAN Take 1 tablet (4 mg total) by mouth every 8 (eight) hours as needed for nausea or vomiting.        Recent Results (from the past 2160 hour(s))  Urinalysis, Routine w reflex microscopic     Status: Abnormal   Collection Time: 07/26/16  1:41 PM  Result Value Ref Range   Color, Urine YELLOW YELLOW   APPearance CLEAR CLEAR   Specific Gravity, Urine 1.031 (H) 1.005 - 1.030   pH 6.0 5.0 - 8.0   Glucose, UA NEGATIVE NEGATIVE mg/dL   Hgb urine dipstick NEGATIVE NEGATIVE   Bilirubin Urine SMALL (A) NEGATIVE   Ketones, ur 40 (A) NEGATIVE mg/dL   Protein, ur NEGATIVE NEGATIVE mg/dL   Nitrite NEGATIVE NEGATIVE   Leukocytes, UA NEGATIVE NEGATIVE    Comment: MICROSCOPIC NOT DONE ON URINES WITH NEGATIVE  PROTEIN, BLOOD, LEUKOCYTES, NITRITE, OR GLUCOSE <1000 mg/dL.  Lipase, blood     Status: None   Collection Time: 07/26/16  7:47 PM  Result Value Ref Range   Lipase 25 11 - 51 U/L  Comprehensive metabolic panel     Status: Abnormal   Collection Time: 07/26/16  7:47 PM  Result Value Ref Range   Sodium 137 135 - 145 mmol/L   Potassium 3.7 3.5 - 5.1 mmol/L   Chloride 105 101 - 111 mmol/L   CO2 22 22 - 32 mmol/L   Glucose, Bld 103 (H) 65 - 99 mg/dL   BUN 16 6 - 20 mg/dL   Creatinine, Ser 1.30 (H) 0.61 - 1.24 mg/dL   Calcium 9.7 8.9 - 10.3 mg/dL   Total Protein 7.7 6.5 - 8.1 g/dL   Albumin 4.8 3.5 - 5.0 g/dL   AST 24 15 - 41 U/L   ALT 20 17 - 63 U/L   Alkaline Phosphatase 47 38 - 126 U/L   Total Bilirubin 0.9 0.3 - 1.2 mg/dL   GFR calc non Af Amer >60 >60 mL/min   GFR calc Af Amer >60 >60 mL/min    Comment: (NOTE) The eGFR has been calculated using the CKD EPI equation. This calculation has not been validated in all clinical situations. eGFR's persistently <60 mL/min signify possible Chronic Kidney Disease.    Anion gap 10 5 - 15  CBC     Status: None   Collection Time: 07/26/16  7:47 PM  Result Value Ref Range   WBC 4.5 4.0 - 10.5 K/uL   RBC 4.79 4.22 - 5.81 MIL/uL   Hemoglobin 15.4 13.0 - 17.0 g/dL   HCT 44.4 39.0 - 52.0 %   MCV 92.7 78.0 - 100.0 fL   MCH 32.2 26.0 - 34.0 pg   MCHC 34.7 30.0 - 36.0 g/dL   RDW 12.9 11.5 - 15.5 %   Platelets 204 150 - 400 K/uL  BASIC METABOLIC PANEL WITH GFR     Status: None   Collection Time: 07/28/16  3:08 PM  Result Value Ref Range   Sodium 139 135 - 146 mmol/L   Potassium 4.6 3.5 - 5.3 mmol/L   Chloride 102 98 - 110 mmol/L   CO2 26 20 - 31 mmol/L   Glucose, Bld 89 65 - 99 mg/dL   BUN 22 7 - 25 mg/dL   Creat 1.28 0.60 - 1.35 mg/dL   Calcium 9.8 8.6 - 10.3 mg/dL   GFR, Est African American 88 >=60 mL/min   GFR, Est  Non African American 76 >=60 mL/min  TSH     Status: None   Collection Time: 07/28/16  3:08 PM  Result Value Ref  Range   TSH 1.90 0.35 - 4.50 uIU/mL  Sed Rate (ESR)     Status: None   Collection Time: 07/28/16  3:08 PM  Result Value Ref Range   Sed Rate 1 0 - 15 mm/hr  C-reactive protein     Status: Abnormal   Collection Time: 07/28/16  3:08 PM  Result Value Ref Range   CRP <0.1 (L) 0.5 - 20.0 mg/dL    Dg Chest 2 View  Result Date: 08/04/2016 CLINICAL DATA:  Pericardial effusion. EXAM: CHEST  2 VIEW COMPARISON:  No recent. FINDINGS: Mediastinum hilar structures normal. Heart size normal. No focal pulmonary infiltrate . No pleural effusion or pneumothorax . IMPRESSION: No acute cardiopulmonary disease. Electronically Signed   By: Marcello Moores  Register   On: 08/04/2016 13:17     ROS: 14 pt review of systems performed and negative (unless mentioned in an HPI)  Objective: BP 125/84 (BP Location: Left Arm, Patient Position: Sitting, Cuff Size: Large)   Pulse 73   Temp 98 F (36.7 C) (Oral)   Resp 20   Ht 6' (1.829 m)   Wt 170 lb (77.1 kg)   SpO2 98%   BMI 23.06 kg/m  Gen: Afebrile. No acute distress. Nontoxic in appearance, well-developed, well-nourished,  AAM. Very pleasant, physically fit.  HENT: AT. [place]. Bilateral TM visualized and normal in appearance, normal external auditory canal. MMM, no oral lesions, adequate dentition. Bilateral nares within normal limits. Throat without erythema, ulcerations or exudates. no Cough on exam, no hoarseness on exam. Eyes:Pupils Equal Round Reactive to light, Extraocular movements intact,  Conjunctiva without redness, discharge or icterus. Neck/lymp/endocrine: Supple,no lymphadenopathy, no thyromegaly CV: RRR no murmur, no edema, +2/4 P posterior tibialis pulses.  Chest: CTAB, no wheeze, rhonchi or crackles. normal Respiratory effort. good Air movement. Abd: Soft. flat. NTND. BS present. no Masses palpated. No hepatosplenomegaly. No rebound tenderness or guarding. Skin: no rashes, purpura or petechiae. Warm and well-perfused. Skin intact. Neuro/Msk:  Normal  gait. PERLA. EOMi. Alert. Oriented x3.  Cranial nerves II through XII intact. Muscle strength 5/5 upper/lower extremity. DTRs equal bilaterally. Psych: Normal affect, dress and demeanor. Normal speech. Normal thought content and judgment.   Assessment/plan: Juan Barnes is a 25 y.o. male present for CPE. Encounter for preventive health examination Patient was encouraged to exercise greater than 150 minutes a week. Patient was encouraged to choose a diet filled with fresh fruits and vegetables, and lean meats. - dicussed early screening for prostate and colon for him, secondary to family history. Explained alarm symptoms/early signs of prostate and colon cancers.  - UTD immunizations. Yearly influenza encouraged.  - HIV completed today  Bilateral impacted cerumen - discussed acute use for current impaction as indicated on the bottle, then use about once a month to maintain since he produces increased wax.  - carbamide peroxide (DEBROX) 6.5 % otic solution; Place 5 drops into both ears 2 (two) times daily.  Dispense: 15 mL; Refill: 1  Pericardial effusion - Reviewed all current labs, which are normal (see above).  - reviewed CXR and ECHO results. All normal, with the exception of "trivial pericardial effusion". - will follow in 6 months to make certain not enlarging. If remains the same or smaller, he remains asymtpomatic and labs normal, would not see indication to follow after repeat.  - monitor for any development of  symptoms (CP, SOB etc).   AVS provided to patient today for education/recommendation on gender specific health and safety maintenance.  Return in about 6 months (around 02/04/2017), or pericardial effusion.  Electronically signed by: Howard Pouch, DO [place]

## 2016-08-04 NOTE — Patient Instructions (Signed)

## 2016-08-05 ENCOUNTER — Telehealth: Payer: Self-pay | Admitting: Family Medicine

## 2016-08-05 DIAGNOSIS — I313 Pericardial effusion (noninflammatory): Secondary | ICD-10-CM

## 2016-08-05 DIAGNOSIS — I3139 Other pericardial effusion (noninflammatory): Secondary | ICD-10-CM

## 2016-08-05 DIAGNOSIS — R768 Other specified abnormal immunological findings in serum: Secondary | ICD-10-CM

## 2016-08-05 LAB — HIV ANTIBODY (ROUTINE TESTING W REFLEX): HIV 1&2 Ab, 4th Generation: NONREACTIVE

## 2016-08-05 LAB — ANTI-NUCLEAR AB-TITER (ANA TITER)

## 2016-08-05 LAB — QUANTIFERON TB GOLD ASSAY (BLOOD)
Interferon Gamma Release Assay: NEGATIVE
QUANTIFERON TB AG MINUS NIL: 0 [IU]/mL
Quantiferon Nil Value: 0.07 IU/mL

## 2016-08-05 LAB — RPR

## 2016-08-05 LAB — ANA: Anti Nuclear Antibody(ANA): POSITIVE — AB

## 2016-08-05 NOTE — Telephone Encounter (Signed)
Please call patient: - His autoimmune screen (ANA) is positive. Remind him this does not necessarily mean he has autoimmune disease, this means that we need to do additional testing to make sure that he does not have lupus causing the inflammation around his heart. All of his other labs have returned normal. I have placed additional labs for him to have collected next week on him out, if any of those return positive, then we will need to see each other via an appointment. If this returned negative we will call him with results.  Labs are placed for future orders.

## 2016-08-06 ENCOUNTER — Other Ambulatory Visit: Payer: BLUE CROSS/BLUE SHIELD

## 2016-08-06 DIAGNOSIS — I313 Pericardial effusion (noninflammatory): Secondary | ICD-10-CM

## 2016-08-06 DIAGNOSIS — R768 Other specified abnormal immunological findings in serum: Secondary | ICD-10-CM

## 2016-08-06 DIAGNOSIS — I3139 Other pericardial effusion (noninflammatory): Secondary | ICD-10-CM

## 2016-08-06 LAB — LACTATE DEHYDROGENASE, ISOENZYMES
LD1/LD2 Ratio: 0.74
LDH 1: 21 % (ref 19–38)
LDH 2: 29 % — ABNORMAL LOW (ref 30–43)
LDH 3: 23 % (ref 16–26)
LDH 4: 12 % (ref 3–12)
LDH 5: 15 % — ABNORMAL HIGH (ref 3–14)
LDH ISOENZYMES, TOTAL: 161 U/L (ref 100–220)

## 2016-08-06 NOTE — Telephone Encounter (Signed)
Spoke with patient reviewed lab results and instuctions. Scheduled lab appt.

## 2016-08-06 NOTE — Telephone Encounter (Signed)
Left message for patient to return call to review lab results. 

## 2016-08-07 LAB — ANTI-SMITH ANTIBODY: ENA SM AB SER-ACNC: NEGATIVE

## 2016-08-07 LAB — ANTI-DNA ANTIBODY, DOUBLE-STRANDED: ds DNA Ab: <1 IU/mL

## 2016-08-08 LAB — HISTONE ANTIBODIES, IGG, BLOOD

## 2016-08-10 LAB — RFLX HEXAGONAL PHASE CONFIRM: HEXAGONAL PHASE CONFIRM: NEGATIVE

## 2016-08-10 LAB — RFX PTT-LA W/RFX TO HEX PHASE CONF: PTT-LA Screen: 48 s — ABNORMAL HIGH (ref <=40)

## 2016-08-10 LAB — RFX DRVVT SCR W/RFLX CONF 1:1 MIX: DRVVT SCREEN: 36 s (ref <=45)

## 2016-08-10 LAB — LUPUS ANTICOAGULANT PANEL

## 2016-08-11 ENCOUNTER — Other Ambulatory Visit: Payer: BLUE CROSS/BLUE SHIELD

## 2016-08-18 ENCOUNTER — Telehealth: Payer: Self-pay | Admitting: Family Medicine

## 2016-08-18 NOTE — Telephone Encounter (Signed)
Please call pt: - his additional labs are negative/normal. - F/U  6 months

## 2016-08-18 NOTE — Telephone Encounter (Signed)
Reviewed lab results and instructions for follow up with patient.

## 2016-08-25 ENCOUNTER — Telehealth: Payer: Self-pay | Admitting: Family Medicine

## 2016-08-25 NOTE — Telephone Encounter (Signed)
Left message on patient voice mail he will need to schedule an appt for evaluation.

## 2016-08-25 NOTE — Telephone Encounter (Signed)
Pt calling for a refill on his clonazepam. Ok to lm on vm when ready for pick up.

## 2016-08-25 NOTE — Telephone Encounter (Signed)
Pt will need to have an appt to discuss the reasoning use of his clonazepam, in which he is asking for refills. We have not prescribed this for him, and since it is a controlled substance.  Please encourage him to make an appt to discuss.

## 2016-08-26 ENCOUNTER — Ambulatory Visit (INDEPENDENT_AMBULATORY_CARE_PROVIDER_SITE_OTHER): Payer: BLUE CROSS/BLUE SHIELD | Admitting: Family Medicine

## 2016-08-26 ENCOUNTER — Encounter: Payer: Self-pay | Admitting: Family Medicine

## 2016-08-26 VITALS — BP 125/80 | HR 69 | Temp 98.5°F | Resp 20 | Wt 171.5 lb

## 2016-08-26 DIAGNOSIS — F418 Other specified anxiety disorders: Secondary | ICD-10-CM | POA: Diagnosis not present

## 2016-08-26 MED ORDER — CLONAZEPAM 0.5 MG PO TABS: 0.5000 mg | ORAL_TABLET | Freq: Three times a day (TID) | ORAL | 0 refills | Status: DC | PRN

## 2016-08-26 MED ORDER — ESCITALOPRAM OXALATE 10 MG PO TABS: ORAL_TABLET | ORAL | 0 refills | Status: DC

## 2016-08-26 NOTE — Patient Instructions (Signed)
Lexapro 10 mg daily for 7 days, then start 20 mg until we see each other in 4 weeks.   Start klonopin use as needed up to 3x a day, then start tapering down as lexapro levels start to work over the next few weeks.  You then will eventually have klonopin as needed on rare occassions for bad attack.

## 2016-08-26 NOTE — Progress Notes (Signed)
Juan Barnes , 07/01/1991, 25 y.o., male MRN: [id number] Patient Care Team    Relationship Specialty Notifications Start End  Natalia Leatherwoodenee A Kuneff, DO PCP - General Family Medicine  07/28/16     CC: anxiety Subjective: Pt presents for an OV with complaints of anxiety of 16 months duration. He has been prescribed Klonopin 0.5 mg TID PRN, in the past by prior provider.  June 2016 patient was having anxiety attacks and could not go into work at the time of start of medication. He felt his heart racing, shaking, could not breath and he went to ED. He states he had a a cardiac workup and the ED doc thought it was all from  anxiety. He was having trouble sleeping at that time as well. He was moving to [place] and starting a different job at that time.  He takes klonopin 2-3x a day. He states it is helpful with his anxiety and he feels scared to not have it for his attacks. 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  Mood disorder: Negative.   [facility] 08/26/2016 07/28/2016  Decreased Interest 0 0  Down, Depressed, Hopeless 2 0  PHQ - 2 Score 2 0  Altered sleeping 3 -  Tired, decreased energy 3 -  Change in appetite 3 -  Feeling bad or failure about yourself  3 -  Trouble concentrating 2 -  Moving slowly or fidgety/restless 3 -  Suicidal thoughts 0 -  PHQ-9 Score 19 -   GAD 7 : Generalized Anxiety Score 08/26/2016  Nervous, Anxious, on Edge 3  Control/stop worrying 3  Worry too much - different things 3  Trouble relaxing 3  Restless 3  Easily annoyed or irritable 3  Afraid - awful might happen 3  Total GAD 7 Score 21  Anxiety Difficulty Extremely difficult       Allergies  Allergen Reactions  . Banana Itching    Itchy/tight throat.    Social History  Substance Use Topics  . Smoking status: Former Smoker    Types: Cigarettes    Quit date: 05/29/2015  . Smokeless tobacco: Never Used  . Alcohol use No    Past Medical History:  Diagnosis Date  . Anxiety attack   . Chicken pox   . Pollen allergy   . Tachycardia, paroxysmal (HCC)    History reviewed. No pertinent surgical history. Family History  Problem Relation Age of Onset  . Prostate cancer Father     [id number]  . Post-traumatic stress disorder Father      Medication List       Accurate as of 08/26/16  3:50 PM. Always use your most recent med list.          carbamide peroxide 6.5 % otic solution Commonly known as:  DEBROX Place 5 drops into both ears 2 (two) times daily.   clonazePAM 0.5 MG tablet Commonly known as:  KLONOPIN Take 0.5 mg by mouth 3 (three) times daily as needed for anxiety.   dicyclomine 20 MG tablet Commonly known as:  BENTYL Take 1 tablet (20 mg total) by mouth 2 (two) times daily.   esomeprazole 40 MG capsule Commonly known as:  NEXIUM Take 40 mg by mouth daily at 12 noon.   MAALOX ADVANCED 200-200-20 MG/5ML suspension Generic drug:  alum & mag hydroxide-simeth Take 10 mLs by mouth 2 (two) times daily as needed for indigestion or  heartburn.   ondansetron 4 MG tablet Commonly known as:  ZOFRAN Take 1 tablet (4 mg total) by mouth every 8 (eight) hours as needed for nausea or vomiting.       No results found for this or any previous visit (from the past 24 hour(s)). No results found.   ROS: Negative, with the exception of above mentioned in HPI   Objective:  BP 125/80 (BP Location: Right Arm, Patient Position: Sitting, Cuff Size: Large)   Pulse 69   Temp 98.5 F (36.9 C)   Resp 20   Wt 171 lb 8 oz (77.8 kg)   SpO2 100%   BMI 23.26 kg/m  Body mass index is 23.26 kg/m. Gen: Afebrile. No acute distress. Nontoxic in appearance, well developed, well nourished. Very pleasant AAM. HENT: AT. [place].MMM. Eyes:Pupils Equal Round Reactive to light, Extraocular movements intact,  Conjunctiva without redness, discharge or icterus. CV: RRR  Psych: mildly anxious. Otherwise normal affect, dress and  demeanor. Normal speech. Normal thought content and judgment.  Assessment/Plan: Juan Barnes is a 25 y.o. male present for OV for depression with Anxiety with depression: - lexapro 10 mg Qd for 7 days, then taper to 20 mg daily.  - klonopin 0.5 mg TID PRN, lengthy conversation surrounding use of klonopin. He is scared he will have a panic attack and not have medicine. I reassured him, he will always have klonopin for an attack, however the goal is to control his anxiety with lexapro, thus klonopin use will be infrequent.  - Pt is agreeable to trial and will start tapering off of klonopin over the next 4 weeks if able.  - F/U 4 weeks.    > 25 minutes spent with patient, >50% of time spent face to face counseling patient and coordinating care.  electronically signed by:  Felix Pacini, DO  [facility] [place]

## 2016-09-02 ENCOUNTER — Telehealth: Payer: Self-pay | Admitting: Family Medicine

## 2016-09-02 DIAGNOSIS — F410 Panic disorder [episodic paroxysmal anxiety] without agoraphobia: Secondary | ICD-10-CM

## 2016-09-02 DIAGNOSIS — F411 Generalized anxiety disorder: Secondary | ICD-10-CM

## 2016-09-02 NOTE — Telephone Encounter (Signed)
Patient wants to make sure he does not have a note of depression in his chart. He only feels like he has anxiety issues, he doesn't feel depressed.

## 2016-09-02 NOTE — Telephone Encounter (Signed)
Noted. No diagnosis code of "depression" has ever been in his chart. Medications prescribed are for ANXIETY and panic attacks.

## 2016-09-23 ENCOUNTER — Other Ambulatory Visit: Payer: Self-pay | Admitting: *Deleted

## 2016-09-23 NOTE — Telephone Encounter (Signed)
Received request for refill on lexapro patient is due fior follow up appt.[ prior to refill. Tried to contact patient voice mail has not been set up

## 2016-09-23 NOTE — Telephone Encounter (Signed)
Received refill request from patient pharmacy for escitalopram 10 mg . Spoke with patient he said he is not taking this medication he states it upset his stomach and gave him headaches. Informed patient he needed follow up appt he declined at this time. Refill on escitalopram denied.

## 2016-10-13 ENCOUNTER — Encounter: Payer: Self-pay | Admitting: Family Medicine

## 2016-10-13 ENCOUNTER — Ambulatory Visit (INDEPENDENT_AMBULATORY_CARE_PROVIDER_SITE_OTHER): Payer: BLUE CROSS/BLUE SHIELD | Admitting: Family Medicine

## 2016-10-13 VITALS — BP 122/87 | HR 80 | Temp 98.5°F | Resp 20 | Wt 173.2 lb

## 2016-10-13 DIAGNOSIS — F410 Panic disorder [episodic paroxysmal anxiety] without agoraphobia: Secondary | ICD-10-CM | POA: Diagnosis not present

## 2016-10-13 DIAGNOSIS — F411 Generalized anxiety disorder: Secondary | ICD-10-CM

## 2016-10-13 MED ORDER — PAROXETINE HCL 10 MG PO TABS: 10.0000 mg | ORAL_TABLET | Freq: Every day | ORAL | 2 refills | Status: DC

## 2016-10-13 MED ORDER — CLONAZEPAM 0.5 MG PO TABS: 0.5000 mg | ORAL_TABLET | Freq: Three times a day (TID) | ORAL | 2 refills | Status: DC | PRN

## 2016-10-13 NOTE — Progress Notes (Signed)
Juan Barnes , 17-Nov-1991, 25 y.o., male MRN: [id number] Patient Care Team    Relationship Specialty Notifications Start End  Natalia Leatherwood, DO PCP - General Family Medicine  07/28/16     CC: anxiety Subjective:  Patient states he is not taking the lexapro 10 mg that was started last visit to hep control his anxiety. He is still taking the klonopin 0.5 mg and usually takes twice a day, sometimes uses three times a day. He reports he felt he was getting more anxiety on the lexapro and he was getting headaches and stomach upset. He is open to trying to take another control medication. He has been on low dose paxil a long time ago and feels that helped, and his uncertain why he stopped it.   Pt presents for an OV with complaints of anxiety of 16 months duration. He has been prescribed Klonopin 0.5 mg TID PRN, in the past by prior provider.  June 2016 patient was having anxiety attacks and could not go into work at the time of start of medication. He felt his heart racing, shaking, could not breath and he went to ED. He states he had a a cardiac workup and the ED doc thought it was all from  anxiety. He was having trouble sleeping at that time as well. He was moving to [place] and starting a different job at that time.  He takes klonopin 2-3x a day. He states it is helpful with his anxiety and he feels scared to not have it for his attacks. 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  Mood disorder: Negative.   Depression screen [facility] 2/9 08/26/2016 07/28/2016  Decreased Interest 0 0  Down, Depressed, Hopeless 2 0  PHQ - 2 Score 2 0  Altered sleeping 3 -  Tired, decreased energy 3 -  Change in appetite 3 -  Feeling bad or failure about yourself  3 -  Trouble concentrating 2 -  Moving slowly or fidgety/restless 3 -  Suicidal thoughts 0 -  PHQ-9 Score 19 -   GAD 7 : Generalized Anxiety Score 08/26/2016  Nervous, Anxious, on Edge 3    Control/stop worrying 3  Worry too much - different things 3  Trouble relaxing 3  Restless 3  Easily annoyed or irritable 3  Afraid - awful might happen 3  Total GAD 7 Score 21  Anxiety Difficulty Extremely difficult       Allergies  Allergen Reactions  . Banana Itching    Itchy/tight throat.    Social History  Substance Use Topics  . Smoking status: Former Smoker    Types: Cigarettes    Quit date: 05/29/2015  . Smokeless tobacco: Never Used  . Alcohol use No   Past Medical History:  Diagnosis Date  . Anxiety attack   . Chicken pox   . Pollen allergy   . Tachycardia, paroxysmal (HCC)    History reviewed. No pertinent surgical history. Family History  Problem Relation Age of Onset  . Prostate cancer Father     56  . Post-traumatic stress disorder Father   . Anxiety disorder Father      Medication List       Accurate as of 10/13/16  1:36 PM. Always use your most recent med list.          clonazePAM 0.5 MG tablet Commonly known as:  KLONOPIN Take 1 tablet (0.5 mg  total) by mouth 3 (three) times daily as needed for anxiety.   escitalopram 10 MG tablet Commonly known as:  LEXAPRO 10 mg QD for 7 days, then 20 mg daily.       No results found for this or any previous visit (from the past 24 hour(s)). No results found.   ROS: Negative, with the exception of above mentioned in HPI   Objective:  There were no vitals taken for this visit. There is no height or weight on file to calculate BMI. Gen: Afebrile. No acute distress. Nontoxic in appearance, well developed, well nourished. Very pleasant AAM. HENT: AT. [place].MMM. Eyes:Pupils Equal Round Reactive to light, Extraocular movements intact,  Conjunctiva without redness, discharge or icterus. CV: RRR  Psych: Anxious.  Otherwise normal affect, dress and demeanor. Normal speech. Normal thought content and judgment.  Assessment/Plan: Leandra KernJoshua Ayub is a 25 y.o. male present for OV for depression with  Anxiety with depression: - Paxil 10 mg Qd  - klonopin 0.5 mg TID PRN. He is aware he is to try to taper back if able.  - F/U 4 weeks if needed, if doing well follow up in 3 months.    > 25 minutes spent with patient, >50% of time spent face to face counseling patient and coordinating care.  electronically signed by:  Felix Pacinienee Kendarius Vigen, DO  [facility] [place]

## 2016-10-13 NOTE — Patient Instructions (Signed)
Start paxil 10 mg daily for generalized anxiety. Continue the klonopin up to every 8 hours as needed only for panic attacks.  Follow up in 3 months if doing well, otherwise follow up sooner if needed.

## 2017-01-19 ENCOUNTER — Emergency Department (HOSPITAL_COMMUNITY)
Admission: EM | Admit: 2017-01-19 | Discharge: 2017-01-19 | Disposition: A | Discharge status: Home / Self Care | Payer: BLUE CROSS/BLUE SHIELD | Attending: Emergency Medicine | Admitting: Emergency Medicine

## 2017-01-19 DIAGNOSIS — Z87891 Personal history of nicotine dependence: Secondary | ICD-10-CM | POA: Diagnosis not present

## 2017-01-19 DIAGNOSIS — Y92009 Unspecified place in unspecified non-institutional (private) residence as the place of occurrence of the external cause: Secondary | ICD-10-CM | POA: Diagnosis not present

## 2017-01-19 DIAGNOSIS — Z79899 Other long term (current) drug therapy: Secondary | ICD-10-CM | POA: Insufficient documentation

## 2017-01-19 DIAGNOSIS — S0990XA Unspecified injury of head, initial encounter: Secondary | ICD-10-CM

## 2017-01-19 DIAGNOSIS — Y9389 Activity, other specified: Secondary | ICD-10-CM | POA: Insufficient documentation

## 2017-01-19 DIAGNOSIS — S00212A Abrasion of left eyelid and periocular area, initial encounter: Secondary | ICD-10-CM | POA: Insufficient documentation

## 2017-01-19 DIAGNOSIS — W228XXA Striking against or struck by other objects, initial encounter: Secondary | ICD-10-CM | POA: Diagnosis not present

## 2017-01-19 DIAGNOSIS — Y999 Unspecified external cause status: Secondary | ICD-10-CM | POA: Diagnosis not present

## 2017-01-19 MED ORDER — KETOROLAC TROMETHAMINE 60 MG/2ML IM SOLN
60.0000 mg | Freq: Once | INTRAMUSCULAR | Status: AC
  Administered 2017-01-19: 60 mg via INTRAMUSCULAR
  Filled 2017-01-19: qty 2

## 2017-01-19 NOTE — Discharge Instructions (Signed)
Continue taking tylenol or motrin should you have rebound headache. Follow-up with your primary care doctor. Return to the ED for new or worsening symptoms.

## 2017-01-19 NOTE — ED Triage Notes (Signed)
Got up at 2 am , walked ito wall hitting head over left eye, no c/o h/a and eye pain , pupils are equal and reactive at 3 mm , pt aaox4

## 2017-01-19 NOTE — ED Provider Notes (Signed)
[facility]-EMERGENCY DEPT Provider Note   CSN: [id number] Arrival date & time: 01/19/17  [id number]     History   Chief Complaint Chief Complaint  Patient presents with  . Head Injury    HPI Juan Barnes is a 26 y.o. male.  The history is provided by the patient and medical records.  Head Injury      26 year old male with history of anxiety, presenting to the ED with head injury. Patient states he got up around 2 AM to go to a different room in his house when he ran into the corner of the wall and struck the left side of his head.  No LOC.  States he continued walking to the other room and went to sleep.  States upon waking around 8am he had a headache.  Took motrin without relief.  He denies any dizziness, confusion, blurred vision, tinnitus, changes in speech, numbness, or weakness. He is not currently on anticoagulation. Patient was able to drive himself to the ED today.  Past Medical History:  Diagnosis Date  . Anxiety attack   . Chicken pox   . Pollen allergy   . Tachycardia, paroxysmal [facility](HCC)     Patient Active Problem List   Diagnosis Date Noted  . Panic attacks 09/02/2016  . Bilateral impacted cerumen 08/04/2016  . Pericardial effusion 07/28/2016  . Generalized anxiety disorder 07/28/2016  . Tachycardia, paroxysmal (HCC) 07/28/2016    No past surgical history on file.     Home Medications    Prior to Admission medications   Medication Sig Start Date End Date Taking? Authorizing Provider  clonazePAM (KLONOPIN) 0.5 MG tablet Take 1 tablet (0.5 mg total) by mouth 3 (three) times daily as needed for anxiety. 10/13/16   Renee A Kuneff, DO  PARoxetine (PAXIL) 10 MG tablet Take 1 tablet (10 mg total) by mouth daily. 10/13/16   Renee A Claiborne BillingsKuneff, DO    Family History Family History  Problem Relation Age of Onset  . Prostate cancer Father     [id number]  . Post-traumatic stress disorder Father   . Anxiety disorder Father     Social History Social History  Substance Use Topics   . Smoking status: Former Smoker    Types: Cigarettes    Quit date: 05/29/2015  . Smokeless tobacco: Never Used  . Alcohol use No     Allergies   Banana   Review of Systems Review of Systems  Neurological: Positive for headaches.  All other systems reviewed and are negative.    Physical Exam Updated Vital Signs BP 120/83   Pulse 80   Temp 98.5 F (36.9 C) (Oral)   Resp 16   SpO2 100%   Physical Exam  Constitutional: He is oriented to person, place, and time. He appears well-developed and well-nourished. No distress.  Observed patient walking back to room with steady gait while texting on cell phone simultaneously  HENT:  Head: Normocephalic and atraumatic.  Right Ear: External ear normal.  Left Ear: External ear normal.  Mouth/Throat: Oropharynx is clear and moist.  Tiny 0.5cm abrasion over left eyebrow, no bleeding, no hematoma or skull depression noted; no hemotympanum  Eyes: Conjunctivae and EOM are normal. Pupils are equal, round, and reactive to light.  Neck: Normal range of motion and full passive range of motion without pain. Neck supple. No neck rigidity.  No rigidity, no meningismus  Cardiovascular: Normal rate, regular rhythm and normal heart sounds.   No murmur heard. Pulmonary/Chest: Effort normal and breath sounds  normal. No respiratory distress. He has no wheezes. He has no rhonchi.  Abdominal: Soft. Bowel sounds are normal. There is no tenderness. There is no guarding.  Musculoskeletal: Normal range of motion. He exhibits no edema.  Neurological: He is alert and oriented to person, place, and time. He has normal strength. He displays no tremor. No cranial nerve deficit or sensory deficit. He displays no seizure activity.  AAOx3, answering questions and following commands appropriately; equal strength UE and LE bilaterally; CN grossly intact; moves all extremities appropriately without ataxia; normal rapid alternating hand movements; normal finger to nose  bilaterally; no focal neuro deficits or facial asymmetry appreciated; norma gait unassisted  Skin: Skin is warm and dry. No rash noted. He is not diaphoretic.  Psychiatric: He has a normal mood and affect. His behavior is normal. Thought content normal.  Nursing note and vitals reviewed.    ED Treatments / Results  Labs (all labs ordered are listed, but only abnormal results are displayed) Labs Reviewed - No data to display  EKG  EKG Interpretation None       Radiology No results found.  Procedures Procedures (including critical care time)  Medications Ordered in ED Medications  ketorolac (TORADOL) injection 60 mg (60 mg Intramuscular Given 01/19/17 1144)     Initial Impression / Assessment and Plan / ED Course  I have reviewed the triage vital signs and the nursing notes.  Pertinent labs & imaging results that were available during my care of the patient were reviewed by me and considered in my medical decision making (see chart for details).  26 year old male here with head injury after walking into a wall and middle of the night. Reports headache without associated symptoms. Patient is awake, alert, appropriately oriented. I observed him walking back to his room through the ER with a steady gait while text him on his cell phone simultaneously.  His neurologic exam is benign and non-focal.  No signs/symptoms concerning for meninigitis.  At this time given benign exam nearly 10 hours post injury, no signs of significant head trauma, no anti-coagulant use, and benign neurologic exam here, low suspicion for acute intracranial pathology.  Given Toradol here, will discharge home with supportive care.  Continue tylenol/motrin at home.  Discussed plan with patient, he acknowledged understanding and agreed with plan of care.  Return precautions given for new or worsening symptoms.  Final Clinical Impressions(s) / ED Diagnoses   Final diagnoses:  Minor head injury, initial encounter      New Prescriptions Discharge Medication List as of 01/19/2017 12:02 PM       Garlon Hatchet, PA-C 01/19/17 1348    Gerhard Munch, MD 01/19/17 [phone]

## 2017-01-21 ENCOUNTER — Encounter: Payer: Self-pay | Admitting: Family Medicine

## 2017-01-21 ENCOUNTER — Ambulatory Visit (INDEPENDENT_AMBULATORY_CARE_PROVIDER_SITE_OTHER): Payer: BLUE CROSS/BLUE SHIELD | Admitting: Family Medicine

## 2017-01-21 VITALS — BP 125/81 | HR 98 | Temp 98.3°F | Resp 20 | Wt 160.0 lb

## 2017-01-21 DIAGNOSIS — R4589 Other symptoms and signs involving emotional state: Secondary | ICD-10-CM | POA: Diagnosis not present

## 2017-01-21 DIAGNOSIS — F411 Generalized anxiety disorder: Secondary | ICD-10-CM | POA: Diagnosis not present

## 2017-01-21 DIAGNOSIS — R45851 Suicidal ideations: Secondary | ICD-10-CM

## 2017-01-21 DIAGNOSIS — R4689 Other symptoms and signs involving appearance and behavior: Secondary | ICD-10-CM | POA: Insufficient documentation

## 2017-01-21 MED ORDER — CLONAZEPAM 0.5 MG PO TABS: 0.5000 mg | ORAL_TABLET | Freq: Three times a day (TID) | ORAL | 2 refills | Status: DC | PRN

## 2017-01-21 MED ORDER — PAROXETINE HCL 30 MG PO TABS: 30.0000 mg | ORAL_TABLET | Freq: Every day | ORAL | 2 refills | Status: DC

## 2017-01-21 NOTE — Progress Notes (Signed)
Juan Barnes , 08/10/1991, 26 y.o., male MRN: [id number] Patient Care Team    Relationship Specialty Notifications Start End  Natalia Leatherwoodenee A Rosalynn Sergent, DO PCP - General Family Medicine  07/28/16     CC: anxiety Subjective:   She presents today for increased anxiety. He is prescribed Paxil 10 mg, and Klonopin 0.5 mg 3 times a day when necessary. He reports needing to take the Klonopin 3 times a day, and still having increased agitation. He feels he has had increasing agitation over the last few months that has progressively become worse. He is having troubles at home with his significant other, who is the mother of his child, which is making his anxiety exacerbated. He states that his nerves are short even at work. He reports when he is finished with work in the evening, he sits outside in his car before going into the home. He feels his significant other is a very difficult person, and controlling. He endorses having suicidal thoughts, which he states he has never had before. Denies any current plan, or suicidal ideations currently. He denies any homicidal ideations. He is vague in his suicidal thoughts, and states he "thinks about a lot of things ". He later asks if he was "paranoid" that make a difference in his diagnosis. When asked what he would be paranoid about, he states he didn't know, it could just be anything. He does not desire counseling or psychiatry referral. He has been on Lexapro trials in the past, and felt more anxious on even a low-dose. His anxiety is reportedly been present intermittently since he was 17. His father also has anxiety and is treated for it. He often feels medication may cause an increase in anxiety, but he is never been on adequate dose to control his anxiety.   Dep[facility] 2/9 08/26/2016 07/28/2016  Decreased Interest 0 0  Down, Depressed, Hopeless 2 0  PHQ - 2 Score 2 0  Altered sleeping 3 -  Tired, decreased energy 3 -  Change in appetite 3 -  Feeling bad or  failure about yourself  3 -  Trouble concentrating 2 -  Moving slowly or fidgety/restless 3 -  Suicidal thoughts 0 -  PHQ-9 Score 19 -   GAD 7 : Generalized Anxiety Score 08/26/2016  Nervous, Anxious, on Edge 3  Control/stop worrying 3  Worry too much - different things 3  Trouble relaxing 3  Restless 3  Easily annoyed or irritable 3  Afraid - awful might happen 3  Total GAD 7 Score 21  Anxiety Difficulty Extremely difficult       Allergies  Allergen Reactions  . Banana Itching    Itchy/tight throat.    Social History  Substance Use Topics  . Smoking status: Former Smoker    Types: Cigarettes    Quit date: 05/29/2015  . Smokeless tobacco: Never Used  . Alcohol use No   Past Medical History:  Diagnosis Date  . Anxiety attack   . Chicken pox   . Pollen allergy   . Tachycardia, paroxysmal (HCC)    History reviewed. No pertinent surgical history. Family History  Problem Relation Age of Onset  . Prostate cancer Father     [id number]  . Post-traumatic stress disorder Father   . Anxiety disorder Father    Allergies as of 01/21/2017      Reactions   Banana Itching   Itchy/tight throat.       Medication List  Accurate as of 01/21/17 11:16 AM. Always use your most recent med list.          clonazePAM 0.5 MG tablet Commonly known as:  KLONOPIN Take 1 tablet (0.5 mg total) by mouth 3 (three) times daily as needed for anxiety.   PARoxetine 10 MG tablet Commonly known as:  PAXIL Take 1 tablet (10 mg total) by mouth daily.       No results found for this or any previous visit (from the past 24 hour(s)). No results found.   ROS: Negative, with the exception of above mentioned in HPI   Objective:  BP 125/81 (BP Location: Left Arm, Patient Position: Sitting, Cuff Size: Large)   Pulse 98   Temp 98.3 F (36.8 C)   Resp 20   Wt 160 lb (72.6 kg)   SpO2 98%   BMI 21.70 kg/m  Body mass index is 21.7 kg/m.  Gen: Afebrile. No acute distress.  HENT: AT. [place].   MMM.  Psych: Fairly anxious, psychomotor agitation present. Fidgety with fingernails. Normal dress and demeanor. Normal speech. ? Paranoid.   Assessment/Plan: Juan Barnes is a 26 y.o. male present for OV for depression with Anxiety with depression/anxiety/increased aggression/suicidal thoughts: - Paxil 30 mg - klonopin 0.5 mg TID PRN. He is aware he is to try to taper back if able.  - Resources provided through [facility], online resources for family services [facility] was provided to patient today. 24-hour crisis line was provided to patient today. - Patient encouraged if he felt he was going to be harmful to himself, or anyone else he is to call EMS report to [facility] long ED/behavioral health. - Patient is in agreement with plan. He was allowed to ask multiple questions, which were answered. - F/U 4 weeks if needed, if doing well follow up in 3 months.    > 25 minutes spent with patient, >50% of time spent face to face counseling patient and coordinating care.  electronically signed by:  Felix Pacini, DO  [facility] - [place]

## 2017-01-21 NOTE — Patient Instructions (Signed)
Start increased dose of paxil. You can take 2 of the paxil pills you have now, and when you get the new refill take one a day then.   You can take the klonopin up to 3 times a day (about 8 hours apart).  If you need an emergent care, suicidal thoughts you feel you are going to act on etc, then please go to [facility] ED.  If you desire a counselor or therapist or a 24 hour talk line, I have provided resources for you by the brochure  and card.    Suicidal Feelings: How to Help Yourself Suicide is the taking of one's own life. If you feel as though life is getting too tough to handle and are thinking about suicide, get help right away. To get help:  Call your local emergency services (911 in the [place]  Call a suicide hotline to speak with a trained counselor who understands how you are feeling. The following is a list of suicide hotlines in the [place]. For a list of hotlines in [place], visit InkDistributor.it.  1-800-273-TALK 8034239201).  1-800-SUICIDE 418-310-8021).  209-201-5567. This is a hotline for Spanish speakers.  6-160-737-1GGY (661) 158-5868). This is a hotline for TTY users.  1-866-4-U-TREVOR (551)425-1300). This is a hotline for lesbian, gay, bisexual, transgender, or questioning youth.  Contact a crisis center or a local suicide prevention center. To find a crisis center or suicide prevention center:  Call your local hospital, clinic, community service organization, mental health center, social service provider, or health department. Ask for assistance in connecting to a crisis center.  Visit https://www.patel-king.com/ for a list of crisis centers in the [place], or visit www.suicideprevention.ca/thinking-about-suicide/find-a-crisis-centre for a list of centers in [place].  Visit the following websites:  National Suicide Prevention Lifeline:  www.suicidepreventionlifeline.org  Hopeline: www.hopeline.com  [employer] for Suicide Prevention: https://www.ayers.com/  [employer] (for lesbian, gay, bisexual, transgender, or questioning youth): www.thetrevorproject.org How can I help myself feel better?  Promise yourself that you will not do anything drastic when you have suicidal feelings. Remember, there is hope. Many people have gotten through suicidal thoughts and feelings, and you will, too. You may have gotten through them before, and this proves that you can get through them again.  Let family, friends, teachers, or counselors know how you are feeling. Try not to isolate yourself from those who care about you. Remember, they will want to help you. Talk with someone every day, even if you do not feel sociable. Face-to-face conversation is best.  Call a mental health professional and see one regularly.  Visit your primary health care provider every year.  Eat a well-balanced diet, and space your meals so you eat regularly.  Get plenty of rest.  Avoid alcohol and drugs, and remove them from your home. They will only make you feel worse.  If you are thinking of taking a lot of medicine, give your medicine to someone who can give it to you one day at a time. If you are on antidepressants and are concerned you will overdose, let your health care provider know so he or she can give you safer medicines. Ask your mental health professional about the possible side effects of any medicines you are taking.  Remove weapons, poisons, knives, and anything else that could harm you from your home.  Try to stick to routines. Follow a schedule every day. Put self-care on your schedule.  Make a list of realistic goals, and cross them off when you  achieve them. Accomplishments give a sense of worth.  Wait until you are feeling better before doing the things you find difficult or unpleasant.  Exercise if you are able. You will feel  better if you exercise for even a half hour each day.  Go out in the sun or into nature. This will help you recover from depression faster. If you have a favorite place to walk, go there.  Do the things that have always given you pleasure. Play your favorite music, read a good book, paint a picture, play your favorite instrument, or do anything else that takes your mind off your depression if it is safe to do.  Keep your living space well lit.  When you are feeling well, write yourself a letter about tips and support that you can read when you are not feeling well.  Remember that life's difficulties can be sorted out with help. Conditions can be treated. You can work on thoughts and strategies that serve you well. This information is not intended to replace advice given to you by your health care provider. Make sure you discuss any questions you have with your health care provider. Document Released: 06/20/2003 Document Revised: 08/12/2016 Document Reviewed: 04/10/2014 Elsevier Interactive Patient Education  2017 [employer]

## 2017-02-05 ENCOUNTER — Ambulatory Visit: Payer: BLUE CROSS/BLUE SHIELD | Admitting: Family Medicine

## 2017-03-02 ENCOUNTER — Encounter: Payer: Self-pay | Admitting: *Deleted

## 2017-04-01 ENCOUNTER — Ambulatory Visit (INDEPENDENT_AMBULATORY_CARE_PROVIDER_SITE_OTHER): Payer: BLUE CROSS/BLUE SHIELD | Admitting: Family Medicine

## 2017-04-01 ENCOUNTER — Encounter: Payer: Self-pay | Admitting: Family Medicine

## 2017-04-01 VITALS — BP 131/82 | HR 81 | Temp 98.5°F | Resp 20 | Wt 156.8 lb

## 2017-04-01 DIAGNOSIS — R5383 Other fatigue: Secondary | ICD-10-CM

## 2017-04-01 DIAGNOSIS — R42 Dizziness and giddiness: Secondary | ICD-10-CM | POA: Diagnosis not present

## 2017-04-01 DIAGNOSIS — F419 Anxiety disorder, unspecified: Secondary | ICD-10-CM

## 2017-04-01 DIAGNOSIS — I479 Paroxysmal tachycardia, unspecified: Secondary | ICD-10-CM

## 2017-04-01 LAB — URINALYSIS, ROUTINE W REFLEX MICROSCOPIC
Bilirubin Urine: NEGATIVE
Hgb urine dipstick: NEGATIVE
Ketones, ur: 15 — AB
Leukocytes, UA: NEGATIVE
Nitrite: NEGATIVE
PH: 6 (ref 5.0–8.0)
RBC / HPF: NONE SEEN (ref 0–?)
SPECIFIC GRAVITY, URINE: 1.015 (ref 1.000–1.030)
TOTAL PROTEIN, URINE-UPE24: NEGATIVE
UROBILINOGEN UA: 0.2 (ref 0.0–1.0)
Urine Glucose: NEGATIVE
WBC UA: NONE SEEN (ref 0–?)

## 2017-04-01 LAB — CBC WITH DIFFERENTIAL/PLATELET
BASOS ABS: 0.1 10*3/uL (ref 0.0–0.1)
Basophils Relative: 4.1 % — ABNORMAL HIGH (ref 0.0–3.0)
EOS ABS: 0.2 10*3/uL (ref 0.0–0.7)
EOS PCT: 7 % — AB (ref 0.0–5.0)
HCT: 43.7 % (ref 39.0–52.0)
Hemoglobin: 14.7 g/dL (ref 13.0–17.0)
Lymphocytes Relative: 45.7 % (ref 12.0–46.0)
Lymphs Abs: 1.2 10*3/uL (ref 0.7–4.0)
MCHC: 33.6 g/dL (ref 30.0–36.0)
MCV: 94.2 fl (ref 78.0–100.0)
MONOS PCT: 7.1 % (ref 3.0–12.0)
Monocytes Absolute: 0.2 10*3/uL (ref 0.1–1.0)
NEUTROS PCT: 36.1 % — AB (ref 43.0–77.0)
Neutro Abs: 0.9 10*3/uL — ABNORMAL LOW (ref 1.4–7.7)
PLATELETS: 168 10*3/uL (ref 150.0–400.0)
RBC: 4.64 Mil/uL (ref 4.22–5.81)
RDW: 13.7 % (ref 11.5–15.5)
WBC: 2.6 10*3/uL — AB (ref 4.0–10.5)

## 2017-04-01 LAB — BASIC METABOLIC PANEL WITH GFR
BUN: 14 mg/dL (ref 7–25)
CALCIUM: 9.6 mg/dL (ref 8.6–10.3)
CO2: 24 mmol/L (ref 20–31)
CREATININE: 1.15 mg/dL (ref 0.60–1.35)
Chloride: 105 mmol/L (ref 98–110)
GFR, Est African American: >89 mL/min (ref >=60)
GFR, Est Non African American: 86 mL/min (ref >=60)
GLUCOSE: 88 mg/dL (ref 65–99)
Potassium: 4 mmol/L (ref 3.5–5.3)
SODIUM: 138 mmol/L (ref 135–146)

## 2017-04-01 LAB — T3, FREE: T3 FREE: 3.4 pg/mL (ref 2.3–4.2)

## 2017-04-01 LAB — TSH: TSH: 0.81 u[IU]/mL (ref 0.35–4.50)

## 2017-04-01 LAB — T4, FREE: FREE T4: 0.82 ng/dL (ref 0.60–1.60)

## 2017-04-01 MED ORDER — PAROXETINE HCL 30 MG PO TABS: 30.0000 mg | ORAL_TABLET | Freq: Every day | ORAL | 5 refills | Status: DC

## 2017-04-01 MED ORDER — CLONAZEPAM 0.5 MG PO TABS: 0.5000 mg | ORAL_TABLET | Freq: Three times a day (TID) | ORAL | 2 refills | Status: DC | PRN

## 2017-04-01 NOTE — Patient Instructions (Signed)
1. Call and schedule an appt with opthalmology to have eyes examined  2. I have refilled all medications to day, follow up in 6 months on anxiety. 3. Change your diet back to the fish/meat/fresh veggies, LOW sodium.  4. HYDRATE.  This all may be secondary to your anxiety. However, I want to make sure this is not electrolytes, thyroid or dehydration.

## 2017-04-01 NOTE — Progress Notes (Signed)
Juan Barnes , 12/18/1991, 26 y.o., male MRN: [id number] Patient Care Team    Relationship Specialty Notifications Start End  Natalia Leatherwood, DO PCP - General Family Medicine  07/28/16     CC: headache, dizziness Subjective: Pt presents for an OV with complaints of Headache of 2 weeks duration.  Patient states she's had headache which is in his temporal areas and radiates to the back of his head. Sometimes it's one-sided either left or right and sometimes it's both sides. He reports he has his girlfriend take his blood pressure when he has a headache and the diastolic number is "high " (unable to provide example ). In addition he states when he stands  he feels like he may pass out. He has had no recent syncopal episodes. He reports his heart will start racing "super fast " when this happens. He reports the passing out feeling  resolves as soon as he sits down. He denies any blood loss. He states he drinks greater than 8 cups of water a day. He reports this had happened once before a few years ago, and he changed his diet to baked fish or chicken and fresh vegetables. Of note he was seen in cardiology 3 years ago, who diagnosed him with SVT after patient worn an event monitor. Patient also has severe generalized anxiety disorder. Of note patient states that when he takes one of his Klonopin his symptoms resolved.  Anxiety: Patient is compliant with Klonopin 2-3 times daily and his Paxil 30 mg daily. He feels well controlled on this medication. He is in need of refills today.  Depression screen [facility] 2/9 08/26/2016 07/28/2016  Decreased Interest 0 0  Down, Depressed, Hopeless 2 0  PHQ - 2 Score 2 0  Altered sleeping 3 -  Tired, decreased energy 3 -  Change in appetite 3 -  Feeling bad or failure about yourself  3 -  Trouble concentrating 2 -  Moving slowly or fidgety/restless 3 -  Suicidal thoughts 0 -  PHQ-9 Score 19 -    Allergies  Allergen Reactions  . Banana Itching    Itchy/tight  throat.    Social History  Substance Use Topics  . Smoking status: Former Smoker    Types: Cigarettes    Quit date: 05/29/2015  . Smokeless tobacco: Never Used  . Alcohol use No   Past Medical History:  Diagnosis Date  . Anxiety attack   . Chicken pox   . Pollen allergy   . Tachycardia, paroxysmal (HCC)    No past surgical history on file. Family History  Problem Relation Age of Onset  . Prostate cancer Father     45  . Post-traumatic stress disorder Father   . Anxiety disorder Father    Allergies as of 04/01/2017      Reactions   Banana Itching   Itchy/tight throat.       Medication List       Accurate as of 04/01/17 10:42 AM. Always use your most recent med list.          clonazePAM 0.5 MG tablet Commonly known as:  KLONOPIN Take 1 tablet (0.5 mg total) by mouth 3 (three) times daily as needed for anxiety.   PARoxetine 30 MG tablet Commonly known as:  PAXIL Take 1 tablet (30 mg total) by mouth daily.       No results found for this or any previous visit (from the past 24 hour(s)). No results found.   ROS:  Negative, with the exception of above mentioned in HPI   Objective:  BP 131/82 (BP Location: Left Arm, Patient Position: Sitting, Cuff Size: Large)   Pulse 81   Temp 98.5 F (36.9 C)   Resp 20   Wt 156 lb 12 oz (71.1 kg)   SpO2 98%   BMI 21.26 kg/m  Body mass index is 21.26 kg/m. Gen: Afebrile. No acute distress. Nontoxic in appearance, well developed, well nourished. Very pleasant African-American male. HENT: AT. [place]. MMM, no oral lesions.  Eyes:Pupils Equal Round Reactive to light, Extraocular movements intact,  Conjunctiva without redness, discharge or icterus. Neck/lymp/endocrine: Supple, no lymphadenopathy, no thyromegaly CV: RRR no murmur, no edema Chest: CTAB, no wheeze or crackles. Good air movement, normal resp effort.  Neuro: No Normal gait. PERLA. EOMi. Alert. Oriented x3  Psych: Anxious. Otherwise, Normal affect, dress and demeanor.  Normal speech. Normal thought content and judgment.  Assessment/Plan: Juan Barnes is a 26 y.o. male present for OV for  Anxiety Dizziness/tachycardia/fatigue - discussed BB use with borderline BP, episodes of heart racing with h/o SVT, and anxiety. He would greatly benefit from this medication, but he does not desire medication.  - He feels anxiety is more stable at the paxil 30 mg dose. He is using klonopin 2-3 times daily. Klonopin improves all symptoms, suggesting more anxiety as cause. - Discussed sodium levels today. Paxil can cause some hyponatremia, so will test BMP today. However, he feels his symptoms are worse with sodium consumption and prior h/o of same sym;toms resolved with change in diet (which was fresh veggies/fruits and lean meats- low sodium).   - If all labs are normal, we'll again try to start patient on beta blocker or strongly suggest he sees cardiology since some of these symptoms could be from more frequent SVT occurrences.  Reviewed expectations re: course of current medical issues.  Discussed self-management of symptoms.  Outlined signs and symptoms indicating need for more acute intervention.  Patient verbalized understanding and all questions were answered.  Patient received an After-Visit Summary.   electronically signed by:  Felix Pacini, DO  [facility] - [place]

## 2017-04-02 ENCOUNTER — Telehealth: Payer: Self-pay | Admitting: Family Medicine

## 2017-04-02 DIAGNOSIS — F411 Generalized anxiety disorder: Secondary | ICD-10-CM

## 2017-04-02 DIAGNOSIS — F410 Panic disorder [episodic paroxysmal anxiety] without agoraphobia: Secondary | ICD-10-CM

## 2017-04-02 NOTE — Telephone Encounter (Signed)
Please call patient: His labs all look really good, with the exception of some low white blood cells (new for him) and signs of dehydration. - I would encourage him to drink more water daily he should be consuming approximately 80 ounces of water a day. - Make the dietary changes we discussed, low in sodium. - I believe the lab changes and the other symptoms he is experiencing are secondary to the Paxil.   - We do have to stop the Paxil, I do not want him to stop it abruptly. He needs to either taper down or start a new medicine.  -  I would like to try him on a low-dose of Cymbalta which is great for anxiety if he is agreeable. -  I will need to repeat his labs in 4 to 6 weeks to make certain his white blood cells returned back to normal after stopping the Paxil. If they do not, I will need to refer him to a hematologist (blood specialist).  - If he would like to discuss in more detail I would be happy to see him in the office, or we can switch the medicines and see him back in 4 weeks and complete the blood work at that time.   *Discuss this with him only if he tells you this is what he wants to do: If he chooses to taper off paxil instead, he can take a half tablet for 2 weeks and then stop.

## 2017-04-05 MED ORDER — DULOXETINE HCL 30 MG PO CPEP: 30.0000 mg | ORAL_CAPSULE | Freq: Every day | ORAL | 1 refills | Status: DC

## 2017-04-05 NOTE — Telephone Encounter (Signed)
Cymbalta prescribed.

## 2017-04-05 NOTE — Telephone Encounter (Signed)
error 

## 2017-04-05 NOTE — Telephone Encounter (Signed)
Spoke with patient reviewed results and detailed information. Patient is agreeable to trying Cymbalta. He will call back and schedule an appt for 4 weeks after starting this medication.

## 2017-04-06 ENCOUNTER — Telehealth: Payer: Self-pay | Admitting: Family Medicine

## 2017-04-06 NOTE — Telephone Encounter (Signed)
Patient requesting call back, states he has a question to ask the nurse regarding lab work done last week.

## 2017-04-06 NOTE — Telephone Encounter (Signed)
Spoke with patient reviewed lab results again. Answered all patient questions. If patient needs any further information per Dr Claiborne Billings he can schedule an appt to discuss with her.

## 2017-04-09 ENCOUNTER — Encounter (HOSPITAL_COMMUNITY): Payer: Self-pay | Admitting: *Deleted

## 2017-04-09 ENCOUNTER — Emergency Department (HOSPITAL_COMMUNITY)
Admission: EM | Admit: 2017-04-09 | Discharge: 2017-04-09 | Disposition: A | Discharge status: Home / Self Care | Payer: BLUE CROSS/BLUE SHIELD | Attending: Emergency Medicine | Admitting: Emergency Medicine

## 2017-04-09 DIAGNOSIS — J069 Acute upper respiratory infection, unspecified: Secondary | ICD-10-CM

## 2017-04-09 DIAGNOSIS — Z87891 Personal history of nicotine dependence: Secondary | ICD-10-CM | POA: Diagnosis not present

## 2017-04-09 DIAGNOSIS — B9789 Other viral agents as the cause of diseases classified elsewhere: Secondary | ICD-10-CM

## 2017-04-09 DIAGNOSIS — J029 Acute pharyngitis, unspecified: Secondary | ICD-10-CM | POA: Diagnosis present

## 2017-04-09 LAB — CBC WITH DIFFERENTIAL/PLATELET
Basophils Absolute: 0 10*3/uL (ref 0.0–0.1)
Basophils Relative: 0 %
Eosinophils Absolute: 0.3 10*3/uL (ref 0.0–0.7)
Eosinophils Relative: 4 %
HCT: 40.4 % (ref 39.0–52.0)
Hemoglobin: 13.8 g/dL (ref 13.0–17.0)
Lymphocytes Relative: 24 %
Lymphs Abs: 1.7 10*3/uL (ref 0.7–4.0)
MCH: 30.8 pg (ref 26.0–34.0)
MCHC: 34.2 g/dL (ref 30.0–36.0)
MCV: 90.2 fL (ref 78.0–100.0)
Monocytes Absolute: 0.7 10*3/uL (ref 0.1–1.0)
Monocytes Relative: 11 %
Neutro Abs: 4.3 10*3/uL (ref 1.7–7.7)
Neutrophils Relative %: 61 %
Platelets: 157 10*3/uL (ref 150–400)
RBC: 4.48 MIL/uL (ref 4.22–5.81)
RDW: 13.6 % (ref 11.5–15.5)
WBC: 7.1 10*3/uL (ref 4.0–10.5)

## 2017-04-09 LAB — RAPID STREP SCREEN (MED CTR MEBANE ONLY): Streptococcus, Group A Screen (Direct): NEGATIVE

## 2017-04-09 MED ORDER — PROMETHAZINE-DM 6.25-15 MG/5ML PO SYRP: 5.0000 mL | ORAL_SOLUTION | Freq: Four times a day (QID) | ORAL | 0 refills | Status: DC | PRN

## 2017-04-09 MED ORDER — IBUPROFEN 800 MG PO TABS: 800.0000 mg | ORAL_TABLET | Freq: Three times a day (TID) | ORAL | 0 refills | Status: DC | PRN

## 2017-04-09 MED ORDER — GUAIFENESIN ER 1200 MG PO TB12: 1.0000 | ORAL_TABLET | Freq: Two times a day (BID) | ORAL | 0 refills | Status: DC

## 2017-04-09 NOTE — ED Notes (Signed)
Pain describes flu like symptoms

## 2017-04-09 NOTE — ED Provider Notes (Signed)
[facility]-EMERGENCY DEPT Provider Note   CSN: [id number] Arrival date & time: 04/09/17  1900  By signing my name below, I, [name], attest that this documentation has been prepared under the direction and in the presence of [employer], PA-C. Electronically Signed: [name], Scribe. 04/09/2017. 8:39 PM.  History   Chief Complaint Chief Complaint  Patient presents with  . Sore Throat   The history is provided by the patient and medical records. No language interpreter was used.    HPI Comments:  Juan Barnes is a 26 y.o. male with a PMhx of Anxiety Attacks and Seasonal Allergies, who presents to the Emergency Department complaining of constant sore throat onset one week ago. Pt reports this sore throat gradually worsening throughout the week, especially after the onset of his productive cough with phlegm leading him to be seen in the ED tonight. His wife reports minimal blood noted one time in the phlegm. Pt has associated symptoms of HA and subjective fever. He did not try anything PTA for relief of his symptoms. Direct palpation exacerbates his throat pain. He additionally notes some concern following a 2.6 WBC at [facility] on 04/01/17 and some mild SOB when he is outside d/t his "severe allergies". Pt denies any other complaints at this time.   Past Medical History:  Diagnosis Date  . Anxiety attack   . Chicken pox   . Pollen allergy   . Tachycardia, paroxysmal [facility])     Patient Active Problem List   Diagnosis Date Noted  . Suicidal thoughts 01/21/2017  . Panic attacks 09/02/2016  . Pericardial effusion 07/28/2016  . Generalized anxiety disorder 07/28/2016  . Tachycardia, paroxysmal (HCC) 07/28/2016   History reviewed. No pertinent surgical history.  Home Medications    Prior to Admission medications   Medication Sig Start Date End Date Taking? Authorizing Provider  clonazePAM (KLONOPIN) 0.5 MG tablet Take 1 tablet (0.5 mg total) by mouth 3 (three)  times daily as needed for anxiety. 04/01/17   Renee A Kuneff, DO  DULoxetine (CYMBALTA) 30 MG capsule Take 1 capsule (30 mg total) by mouth daily. 04/05/17   Renee A Claiborne Billings, DO    Family History Family History  Problem Relation Age of Onset  . Prostate cancer Father     39  . Post-traumatic stress disorder Father   . Anxiety disorder Father     Social History Social History  Substance Use Topics  . Smoking status: Former Smoker    Types: Cigarettes    Quit date: 05/29/2015  . Smokeless tobacco: Never Used  . Alcohol use No     Allergies   Banana   Review of Systems Review of Systems All systems reviewed and are negative for acute change except as noted in the HPI.    Physical Exam Updated Vital Signs BP 125/89   Pulse 91   Temp 98.6 F (37 C) (Oral)   Resp 18   Ht  (1.956 m)   Wt 148 lb (67.1 kg)   SpO2 100%   BMI 17.55 kg/m   Physical Exam  Constitutional: He is oriented to person, place, and time. He appears well-developed and well-nourished. No distress.  HENT:  Head: Normocephalic and atraumatic.  Eyes: Pupils are equal, round, and reactive to light.  Pulmonary/Chest: Effort normal. He has no wheezes.  Lymphadenopathy:    He has cervical adenopathy (anterior with tenderness bilaterally).  Neurological: He is alert and oriented to person, place, and time.  Skin: Skin  is warm and dry.  Psychiatric: He has a normal mood and affect.  Nursing note and vitals reviewed.    ED Treatments / Results  DIAGNOSTIC STUDIES:  Oxygen Saturation is 100% on RA, normal by my interpretation.    COORDINATION OF CARE:  8:39 PM Discussed treatment plan with pt at bedside including rapid antigen test with strep cultures and blood work and pt agreed to plan.  Labs (all labs ordered are listed, but only abnormal results are displayed) Labs Reviewed  RAPID STREP SCREEN (NOT AT [facility])  CULTURE, GROUP A STREP [facility])    EKG  EKG Interpretation None        Radiology No results found.  Procedures Procedures (including critical care time)  Medications Ordered in ED Medications - No data to display   Initial Impression / Assessment and Plan / ED Course  I have reviewed the triage vital signs and the nursing notes.  Pertinent labs & imaging results that were available during my care of the patient were reviewed by me and considered in my medical decision making (see chart for details).     Patient will be treated for upper respiratory symptoms.  Told to return here as needed.  Patient given the plan and all questions were answered  Final Clinical Impressions(s) / ED Diagnoses   Final diagnoses:  None    New Prescriptions New Prescriptions   No medications on file  I personally performed the services described in this documentation, which was scribed in my presence. The recorded information has been reviewed and is accurate.    Charlestine Night, PA-C 04/15/17 1610    Lyndal Pulley, MD 04/16/17 [phone]

## 2017-04-09 NOTE — ED Triage Notes (Signed)
Pt reports sore throat (x 1 week), headache, fever, and cough. No meds just PTA

## 2017-04-09 NOTE — Discharge Instructions (Signed)
Return here as needed. Your WBC 7.1 INcrease your fluid intake. Rest as much as possible.

## 2017-04-12 LAB — CULTURE, GROUP A STREP (THRC)

## 2017-04-17 ENCOUNTER — Emergency Department (HOSPITAL_COMMUNITY)
Admission: EM | Admit: 2017-04-17 | Discharge: 2017-04-17 | Disposition: A | Discharge status: Home / Self Care | Payer: BLUE CROSS/BLUE SHIELD | Attending: Emergency Medicine | Admitting: Emergency Medicine

## 2017-04-17 ENCOUNTER — Encounter (HOSPITAL_COMMUNITY): Payer: Self-pay | Admitting: Emergency Medicine

## 2017-04-17 DIAGNOSIS — J029 Acute pharyngitis, unspecified: Secondary | ICD-10-CM | POA: Diagnosis not present

## 2017-04-17 DIAGNOSIS — Z87891 Personal history of nicotine dependence: Secondary | ICD-10-CM | POA: Insufficient documentation

## 2017-04-17 LAB — RAPID STREP SCREEN (MED CTR MEBANE ONLY): Streptococcus, Group A Screen (Direct): NEGATIVE

## 2017-04-17 MED ORDER — GI COCKTAIL ~~LOC~~
30.0000 mL | Freq: Once | ORAL | Status: AC
  Administered 2017-04-17: 30 mL via ORAL
  Filled 2017-04-17: qty 30

## 2017-04-17 MED ORDER — IBUPROFEN 200 MG PO TABS
600.0000 mg | ORAL_TABLET | Freq: Once | ORAL | Status: AC
  Administered 2017-04-17: 600 mg via ORAL
  Filled 2017-04-17: qty 3

## 2017-04-17 MED ORDER — MAGIC MOUTHWASH W/LIDOCAINE: 5.0000 mL | Freq: Four times a day (QID) | ORAL | 0 refills | Status: DC | PRN

## 2017-04-17 NOTE — ED Provider Notes (Signed)
[facility]-EMERGENCY DEPT Provider Note   CSN: [id number] Arrival date & time: 04/17/17  1944  By signing my name below, I, Juan Barnes, attest that this documentation has been prepared under the direction and in the presence of Demetrios Loll, PA-C.  Electronically Signed: Rosario Barnes, ED Scribe. 04/17/17. 8:31 PM.  History   Chief Complaint Chief Complaint  Patient presents with  . Sore Throat   The history is provided by the patient and medical records. No language interpreter was used.    HPI Comments: Juan Barnes is a 26 y.o. male with a h/o seasonal allergies, who presents to the Emergency Department complaining of intermittent, unchanged sensation of right sided throat discomfort beginning one week ago. He notes that his pain is mainly present with eating certain foods and swallowing only. He is able to tolerate his own secretions well and w/o difficulty. He also mentions that he was experiencing right-sided ear pain approximately one week ago, but this has since resolved. Per prior chart review, pt was seen in the ED with a sore throat and cough on 04/09/17 and at that time he underwent lab working including rapid strep and CBC, both of which were unremarkable. He was dx'd w/ likely viral URI at that time. His symptoms mostly resolved from this encounter, however, he has experienced this mild throat discomfort since. No treatments for his symptoms were tried prior to coming into the ED. No h/o GERD. He is currently sexually active with only one male partner; he has not recently engaged in oral sex. His partner is currently asymptomatic. He denies fever, rhinorrhea, cough, mouth sores, penile discharge, testicle pain/swelling, neck stiffness, or any other associated symptoms.   Past Medical History:  Diagnosis Date  . Anxiety attack   . Chicken pox   . Pollen allergy   . Tachycardia, paroxysmal [facility])    Patient Active Problem List   Diagnosis Date Noted  .  Suicidal thoughts 01/21/2017  . Panic attacks 09/02/2016  . Pericardial effusion 07/28/2016  . Generalized anxiety disorder 07/28/2016  . Tachycardia, paroxysmal (HCC) 07/28/2016   History reviewed. No pertinent surgical history.  Home Medications    Prior to Admission medications   Medication Sig Start Date End Date Taking? Authorizing Provider  clonazePAM (KLONOPIN) 0.5 MG tablet Take 1 tablet (0.5 mg total) by mouth 3 (three) times daily as needed for anxiety. 04/01/17   Renee A Kuneff, DO  DULoxetine (CYMBALTA) 30 MG capsule Take 1 capsule (30 mg total) by mouth daily. 04/05/17   Renee A Kuneff, DO  Guaifenesin 1200 MG TB12 Take 1 tablet (1,200 mg total) by mouth 2 (two) times daily. 04/09/17   Charlestine Night, PA-C  ibuprofen (ADVIL,MOTRIN) 800 MG tablet Take 1 tablet (800 mg total) by mouth every 8 (eight) hours as needed. 04/09/17   Charlestine Night, PA-C  promethazine-dextromethorphan (PROMETHAZINE-DM) 6.25-15 MG/5ML syrup Take 5 mLs by mouth 4 (four) times daily as needed for cough. 04/09/17   Charlestine Night, PA-C   Family History Family History  Problem Relation Age of Onset  . Prostate cancer Father     7  . Post-traumatic stress disorder Father   . Anxiety disorder Father    Social History Social History  Substance Use Topics  . Smoking status: Former Smoker    Types: Cigarettes    Quit date: 05/29/2015  . Smokeless tobacco: Never Used  . Alcohol use No   Allergies   Banana  Review of Systems Review of Systems  Constitutional: Negative  for fever.  HENT: Positive for sore throat. Negative for mouth sores and rhinorrhea.   Respiratory: Negative for cough.   Genitourinary: Negative for discharge, scrotal swelling and testicular pain.  Musculoskeletal: Negative for neck stiffness.  All other systems reviewed and are negative.  Physical Exam Updated Vital Signs BP 138/86 (BP Location: Left Arm)   Pulse 74   Temp 98.1 F (36.7 C) (Oral)   Resp 18   Ht 5'  11" (1.803 m)   Wt 150 lb (68 kg)   SpO2 100%   BMI 20.92 kg/m   Physical Exam  Constitutional: He appears well-developed and well-nourished. No distress.  Nontoxic appearing.  HENT:  Head: Normocephalic and atraumatic.  Posterior oropharynx is erythematous. No edema, no exudates. Has a cobblestone appearance. No purulent drainage. Mild b/l tender cervical adenopathy. Ears, TMs, canals are normal bilaterally. Nose normal. Speaking in complete sentences. Managing secretions and maintaining airway.   No uvular deviation. No muffled voice.  Eyes: Conjunctivae are normal.  Neck: Normal range of motion. Neck supple.  No nuchal rigidity.  Cardiovascular: Normal rate, regular rhythm and normal heart sounds.   No murmur heard. Pulmonary/Chest: Effort normal and breath sounds normal. No respiratory distress. He has no wheezes. He has no rales.  Abdominal: He exhibits no distension.  Musculoskeletal: Normal range of motion.  Neurological: He is alert.  Skin: No pallor.  Psychiatric: He has a normal mood and affect. His behavior is normal.  Nursing note and vitals reviewed.  ED Treatments / Results  DIAGNOSTIC STUDIES: Oxygen Saturation is 100% on RA, normal by my interpretation.   COORDINATION OF CARE: 8:31 PM-Discussed next steps with pt. Pt verbalized understanding and is agreeable with the plan.   Labs (all labs ordered are listed, but only abnormal results are displayed) Labs Reviewed  RAPID STREP SCREEN (NOT AT [facility])  CULTURE, GROUP A STREP [facility])    EKG  EKG Interpretation None      Radiology No results found.  Procedures Procedures  Medications Ordered in ED Medications  gi cocktail (Maalox,Lidocaine,Donnatal) (30 mLs Oral Given 04/17/17 2109)  ibuprofen (ADVIL,MOTRIN) tablet 600 mg (600 mg Oral Given 04/17/17 2109)    Initial Impression / Assessment and Plan / ED Course  I have reviewed the triage vital signs and the nursing notes.  Pertinent labs & imaging  results that were available during my care of the patient were reviewed by me and considered in my medical decision making (see chart for details).     Patient presents to ED with complaints of sore throat that is painful on the right side and with certain foods when he swallows describes as a burning sensation. Oropharynx with mild erythema and cobblestone appearance. No edema of the tonsils. No uvular deviation concerning for PTA. No muffled voice or neck stiffness concerning for deep neck infection. Vital signs are stable. Patient's strep test was negative. This seems more chronic in nature. Low suspicion for gonococcal pharyngitis. Patient was given a GI cocktail and states that that helped the pain in his throat when he swallowed. We'll send patient home on Magic mouthwash. Have given him ENT follow-up. Patient is able to maintain secretions and speaking complete sentences. No airway compromise. Pt is hemodynamically stable, in NAD, & able to ambulate in the ED. Pain has been managed & has no complaints prior to dc. Pt is comfortable with above plan and is stable for discharge at this time. All questions were answered prior to disposition. Strict return precautions for  f/u to the ED were discussed. Discussed patient with Dr. Fayrene Fearing was agreeable to above plan.   Final Clinical Impressions(s) / ED Diagnoses   Final diagnoses:  Sore throat   New Prescriptions Discharge Medication List as of 04/17/2017 10:10 PM    START taking these medications   Details  magic mouthwash w/lidocaine SOLN Take 5 mLs by mouth 4 (four) times daily as needed for mouth pain. Gargle, swish and spit out, Starting Sat 04/17/2017, Print       I personally performed the services described in this documentation, which was scribed in my presence. The recorded information has been reviewed and is accurate.    Rise Mu, PA-C 04/19/17 0056    Rolland Porter, MD 05/01/17 [phone]

## 2017-04-17 NOTE — ED Triage Notes (Addendum)
Pt reports being seen appx 1 week prior with sore throat and states that on Thursday he began having pain to right side of throat with pain on swallowing certain food. Pt reports burning feeling when swallowing. No acute distress at this time. Pt able to speak in full sentences no acute distress.

## 2017-04-17 NOTE — Discharge Instructions (Signed)
Strep test was negative. Use the mouth was for pain. Follow up with ENT doctor. Return to the ED if your symptoms worsen.

## 2017-04-17 NOTE — ED Notes (Signed)
Pt c/o discomfort in his throat when swallowing, he stated, "it feels like a canker sore in my throat." He stated he has had this feeling since the last time he was here in the hospital.

## 2017-04-20 LAB — CULTURE, GROUP A STREP (THRC)

## 2017-04-24 ENCOUNTER — Emergency Department (HOSPITAL_COMMUNITY): Payer: BLUE CROSS/BLUE SHIELD

## 2017-04-24 ENCOUNTER — Emergency Department (HOSPITAL_COMMUNITY)
Admission: EM | Admit: 2017-04-24 | Discharge: 2017-04-24 | Disposition: A | Discharge status: Home / Self Care | Payer: BLUE CROSS/BLUE SHIELD | Attending: Emergency Medicine | Admitting: Emergency Medicine

## 2017-04-24 ENCOUNTER — Encounter (HOSPITAL_COMMUNITY): Payer: Self-pay

## 2017-04-24 DIAGNOSIS — W3400XA Accidental discharge from unspecified firearms or gun, initial encounter: Secondary | ICD-10-CM | POA: Diagnosis not present

## 2017-04-24 DIAGNOSIS — S61012A Laceration without foreign body of left thumb without damage to nail, initial encounter: Secondary | ICD-10-CM | POA: Diagnosis not present

## 2017-04-24 DIAGNOSIS — Y929 Unspecified place or not applicable: Secondary | ICD-10-CM | POA: Diagnosis not present

## 2017-04-24 DIAGNOSIS — Y999 Unspecified external cause status: Secondary | ICD-10-CM | POA: Insufficient documentation

## 2017-04-24 DIAGNOSIS — Y9389 Activity, other specified: Secondary | ICD-10-CM | POA: Diagnosis not present

## 2017-04-24 DIAGNOSIS — Z23 Encounter for immunization: Secondary | ICD-10-CM | POA: Diagnosis not present

## 2017-04-24 DIAGNOSIS — Z87891 Personal history of nicotine dependence: Secondary | ICD-10-CM | POA: Diagnosis not present

## 2017-04-24 MED ORDER — LIDOCAINE HCL 2 % IJ SOLN
10.0000 mL | Freq: Once | INTRAMUSCULAR | Status: AC
  Administered 2017-04-24: 200 mg
  Filled 2017-04-24: qty 20

## 2017-04-24 MED ORDER — BACITRACIN ZINC 500 UNIT/GM EX OINT
TOPICAL_OINTMENT | Freq: Two times a day (BID) | CUTANEOUS | Status: DC
  Administered 2017-04-24: 1 via TOPICAL
  Filled 2017-04-24: qty 0.9

## 2017-04-24 MED ORDER — TETANUS-DIPHTH-ACELL PERTUSSIS 5-2.5-18.5 LF-MCG/0.5 IM SUSP
0.5000 mL | Freq: Once | INTRAMUSCULAR | Status: AC
  Administered 2017-04-24: 0.5 mL via INTRAMUSCULAR
  Filled 2017-04-24: qty 0.5
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
## 2017-04-24 NOTE — Discharge Instructions (Signed)
Take Tylenol or ibuprofen as needed for pain.  Keep wound dry for the first 24 hours. After that he may gently wash it with soap and water. Make sure you pat it dry very carefully before putting another dressing on it.  I provided to the number of the hand surgeon that he can contact if you have any further issues or concerns.  Return to the emergency department in 5 days for suture removal. Return to the emergency Department sooner if he experiencing any fever, increased pain, increased swelling/redness to the thumb or drainage from the site.
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
## 2017-04-24 NOTE — ED Notes (Signed)
PT DISCHARGED. INSTRUCTIONS GIVEN. AAOX4. PT IN NO APPARENT DISTRESS. THE OPPORTUNITY TO ASK QUESTIONS WAS PROVIDED. 

## 2017-04-25 ENCOUNTER — Encounter: Payer: Self-pay | Admitting: Family Medicine

## 2017-04-29 ENCOUNTER — Encounter (HOSPITAL_COMMUNITY): Payer: Self-pay

## 2017-04-29 ENCOUNTER — Emergency Department (HOSPITAL_COMMUNITY)
Admission: EM | Admit: 2017-04-29 | Discharge: 2017-04-29 | Disposition: A | Discharge status: Home / Self Care | Payer: BLUE CROSS/BLUE SHIELD | Attending: Emergency Medicine | Admitting: Emergency Medicine

## 2017-04-29 DIAGNOSIS — Z87891 Personal history of nicotine dependence: Secondary | ICD-10-CM | POA: Insufficient documentation

## 2017-04-29 DIAGNOSIS — Z4802 Encounter for removal of sutures: Secondary | ICD-10-CM | POA: Diagnosis not present

## 2017-04-29 DIAGNOSIS — Z79899 Other long term (current) drug therapy: Secondary | ICD-10-CM | POA: Insufficient documentation

## 2017-04-29 DIAGNOSIS — T148XXA Other injury of unspecified body region, initial encounter: Secondary | ICD-10-CM

## 2017-04-29 DIAGNOSIS — L089 Local infection of the skin and subcutaneous tissue, unspecified: Secondary | ICD-10-CM | POA: Insufficient documentation

## 2017-04-29 MED ORDER — SULFAMETHOXAZOLE-TRIMETHOPRIM 800-160 MG PO TABS: 1.0000 | ORAL_TABLET | Freq: Two times a day (BID) | ORAL | 0 refills | Status: DC

## 2017-04-29 NOTE — ED Notes (Signed)
PT DISCHARGED. INSTRUCTIONS AND PRESCRIPTION GIVEN. AAOX4. PT IN NO APPARENT DISTRESS. THE OPPORTUNITY TO ASK QUESTIONS WAS PROVIDED. 

## 2017-04-29 NOTE — Discharge Instructions (Signed)
Read the information below.  Use the prescribed medication as directed.  Please discuss all new medications with your pharmacist.  You may return to the Emergency Department at any time for worsening condition or any new symptoms that concern you.   If you develop increased redness, swelling, pus draining from the wound, increased pain, or fevers greater than 100.4, return to the ER immediately for a recheck.   °
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
## 2017-04-29 NOTE — ED Triage Notes (Signed)
PT HERE FOR WOUND CHECK AND SUTURE REMOVAL TO THE LEFT THUMB. PT HAS SWELLING, REDNESS, AND MINIMAL SEROSANGUINOUS DRAINAGE. DENIES FEVER.

## 2017-05-03 ENCOUNTER — Encounter: Payer: Self-pay | Admitting: Family Medicine

## 2017-05-18 ENCOUNTER — Emergency Department (HOSPITAL_COMMUNITY)
Admission: EM | Admit: 2017-05-18 | Discharge: 2017-05-18 | Disposition: A | Discharge status: Home / Self Care | Payer: Medicaid Other | Attending: Physician Assistant | Admitting: Physician Assistant

## 2017-05-18 ENCOUNTER — Emergency Department (HOSPITAL_COMMUNITY): Payer: Medicaid Other

## 2017-05-18 DIAGNOSIS — G43109 Migraine with aura, not intractable, without status migrainosus: Secondary | ICD-10-CM | POA: Insufficient documentation

## 2017-05-18 DIAGNOSIS — Z87891 Personal history of nicotine dependence: Secondary | ICD-10-CM | POA: Diagnosis not present

## 2017-05-18 DIAGNOSIS — Z79899 Other long term (current) drug therapy: Secondary | ICD-10-CM | POA: Insufficient documentation

## 2017-05-18 DIAGNOSIS — R51 Headache: Secondary | ICD-10-CM | POA: Diagnosis present

## 2017-05-18 MED ORDER — TETRACAINE HCL 0.5 % OP SOLN
2.0000 [drp] | Freq: Once | OPHTHALMIC | Status: AC
  Administered 2017-05-18: 2 [drp] via OPHTHALMIC
  Filled 2017-05-18: qty 4

## 2017-05-18 MED ORDER — DEXAMETHASONE SODIUM PHOSPHATE 10 MG/ML IJ SOLN
10.0000 mg | Freq: Once | INTRAMUSCULAR | Status: AC
  Administered 2017-05-18: 10 mg via INTRAVENOUS
  Filled 2017-05-18: qty 1

## 2017-05-18 MED ORDER — SODIUM CHLORIDE 0.9 % IV BOLUS (SEPSIS)
1000.0000 mL | Freq: Once | INTRAVENOUS | Status: AC
  Administered 2017-05-18: 1000 mL via INTRAVENOUS

## 2017-05-18 MED ORDER — DIPHENHYDRAMINE HCL 50 MG/ML IJ SOLN
25.0000 mg | Freq: Once | INTRAMUSCULAR | Status: AC
  Administered 2017-05-18: 25 mg via INTRAVENOUS
  Filled 2017-05-18: qty 1

## 2017-05-18 MED ORDER — BUTALBITAL-APAP-CAFFEINE 50-325-40 MG PO TABS: 1.0000 | ORAL_TABLET | Freq: Four times a day (QID) | ORAL | 0 refills | Status: DC | PRN

## 2017-05-18 MED ORDER — PROCHLORPERAZINE EDISYLATE 5 MG/ML IJ SOLN
10.0000 mg | Freq: Once | INTRAMUSCULAR | Status: AC
  Administered 2017-05-18: 10 mg via INTRAVENOUS
  Filled 2017-05-18: qty 2

## 2017-05-18 MED ORDER — KETOROLAC TROMETHAMINE 30 MG/ML IJ SOLN
30.0000 mg | Freq: Once | INTRAMUSCULAR | Status: AC
  Administered 2017-05-18: 30 mg via INTRAVENOUS
  Filled 2017-05-18: qty 1

## 2017-05-18 NOTE — ED Notes (Signed)
Writer performed a visual acuity with pt, pt sts he does wear glasses.

## 2017-05-18 NOTE — ED Notes (Signed)
Patient transported to CT 

## 2017-05-18 NOTE — ED Triage Notes (Addendum)
Pt c/o episodes of bilateral headache and nausea x 1 week. Pain begins with unilateral sharp pain behind eye, different episodes occur on different sides of face, followed by head pain in variable locations, usually diffuse throughout bilateral head, now radiating to ears. Occasionally episodes of unilateral facial numbness, alternates sides. No vision changes, unilateral weakness. No history of similar headaches.

## 2017-05-18 NOTE — Discharge Instructions (Signed)
Read the information below.  Use the prescribed medication as directed.  Please discuss all new medications with your pharmacist.  You may return to the Emergency Department at any time for worsening condition or any new symptoms that concern you.   Please call one of the neurology practices listed above to schedule a follow up appointment with a headache specialist.    You are having a headache. No specific cause was found today for your headache. It may have been a migraine or other cause of headache. Stress, anxiety, fatigue, and depression are common triggers for headaches. Your headache today does not appear to be life-threatening or require hospitalization, but often the exact cause of headaches is not determined in the emergency department. Therefore, follow-up with your doctor is very important to find out what may have caused your headache, and whether or not you need any further diagnostic testing or treatment. Sometimes headaches can appear benign (not harmful), but then more serious symptoms can develop which should prompt an immediate re-evaluation by your doctor or the emergency department. SEEK MEDICAL ATTENTION IF: You develop possible problems with medications prescribed.  The medications don't resolve your headache, if it recurs , or if you have multiple episodes of vomiting or can't take fluids. You have a change from the usual headache. RETURN IMMEDIATELY IF you develop a sudden, severe headache or confusion, become poorly responsive or faint, develop a fever above 100.59F or problem breathing, have a change in speech, vision, swallowing, or understanding, or develop new weakness, numbness, tingling, incoordination, or have a seizure.

## 2017-05-18 NOTE — ED Provider Notes (Signed)
[facility]-EMERGENCY DEPT Provider Note   CSN: [id number] Arrival date & time: 05/18/17  1839     History   Chief Complaint Chief Complaint  Patient presents with  . Headache    HPI Juan Barnes is a 26 y.o. male.  HPI   Pt p/w headaches that has been present for 1.5 weeks.  States the headache starts on one side - first in the right eye and right back of his head and down his right neck, lasts up to 10 seconds, then stops and stars on the left side, then sometimes both sides.  Has associated blurry vision and lightsensitivity, nausea.  Occasional left-sided arm numbness that comes and goes with the headache.  Denies head injury, weakness of the extremities, recent illness, CP, SOB, neck pain or stiffness.    Past Medical History:  Diagnosis Date  . Anxiety attack   . Chicken pox   . Pollen allergy   . Tachycardia, paroxysmal [facility])     Patient Active Problem List   Diagnosis Date Noted  . Suicidal thoughts 01/21/2017  . Panic attacks 09/02/2016  . Pericardial effusion 07/28/2016  . Generalized anxiety disorder 07/28/2016  . Tachycardia, paroxysmal (HCC) 07/28/2016    No past surgical history on file.     Home Medications    Prior to Admission medications   Medication Sig Start Date End Date Taking? Authorizing Provider  clonazePAM (KLONOPIN) 0.5 MG tablet Take 1 tablet (0.5 mg total) by mouth 3 (three) times daily as needed for anxiety. 04/01/17  Yes Kuneff, Renee A, DO  ibuprofen (ADVIL,MOTRIN) 800 MG tablet Take 1 tablet (800 mg total) by mouth every 8 (eight) hours as needed. 04/09/17  Yes Lawyer, Cristal Deer, PA-C  butalbital-acetaminophen-caffeine (FIORICET, ESGIC) [phone] MG tablet Take 1 tablet by mouth every 6 (six) hours as needed for headache. 05/18/17   Trixie Dredge, PA-C  DULoxetine (CYMBALTA) 30 MG capsule Take 1 capsule (30 mg total) by mouth daily. Patient not taking: Reported on 05/18/2017 04/05/17   Felix Pacini A, DO  Guaifenesin 1200 MG TB12 Take 1  tablet (1,200 mg total) by mouth 2 (two) times daily. Patient not taking: Reported on 05/18/2017 04/09/17   Charlestine Night, PA-C  magic mouthwash w/lidocaine SOLN Take 5 mLs by mouth 4 (four) times daily as needed for mouth pain. Gargle, swish and spit out Patient not taking: Reported on 05/18/2017 04/17/17   Demetrios Loll T, PA-C  promethazine-dextromethorphan (PROMETHAZINE-DM) 6.25-15 MG/5ML syrup Take 5 mLs by mouth 4 (four) times daily as needed for cough. Patient not taking: Reported on 05/18/2017 04/09/17   Charlestine Night, PA-C  sulfamethoxazole-trimethoprim (BACTRIM DS,SEPTRA DS) 800-160 MG tablet Take 1 tablet by mouth 2 (two) times daily. X 7 days Patient not taking: Reported on 05/18/2017 04/29/17   Trixie Dredge, PA-C    Family History Family History  Problem Relation Age of Onset  . Prostate cancer Father        34  . Post-traumatic stress disorder Father   . Anxiety disorder Father     Social History Social History  Substance Use Topics  . Smoking status: Former Smoker    Types: Cigarettes    Quit date: 05/29/2015  . Smokeless tobacco: Never Used  . Alcohol use No     Allergies   Banana   Review of Systems Review of Systems  All other systems reviewed and are negative.    Physical Exam Updated Vital Signs BP 108/78 (BP Location: Right Arm)   Pulse 88   Temp  98.1 F (36.7 C) (Oral)   Resp 18   SpO2 99%   Physical Exam  Constitutional: He appears well-developed and well-nourished. No distress.  HENT:  Head: Normocephalic and atraumatic.  Eyes:  Pressure in right eye 13, left eye 12.   Neck: Neck supple.  Cardiovascular: Normal rate and regular rhythm.   Pulmonary/Chest: Effort normal and breath sounds normal. No respiratory distress. He has no wheezes. He has no rales.  Abdominal: Soft. He exhibits no distension and no mass. There is no tenderness. There is no rebound and no guarding.  Neurological: He is alert. He exhibits normal muscle tone.    CN II-XII intact though increased sensation over left face, EOMs intact, no pronator drift, grip strengths equal bilaterally; strength 5/5 in all extremities though decreased with hip flexion - leg is weaker and seems less coordinated,  sensation intact in all extremities though decreased on left leg and left arm; finger to nose, heel to shin, rapid alternating movements normal.     Skin: He is not diaphoretic.  Nursing note and vitals reviewed.    ED Treatments / Results  Labs (all labs ordered are listed, but only abnormal results are displayed) Labs Reviewed - No data to display  EKG  EKG Interpretation None       Radiology Ct Head Wo Contrast  Result Date: 05/18/2017 CLINICAL DATA:  Bilateral headache with nausea sharp pain behind the eye EXAM: CT HEAD WITHOUT CONTRAST TECHNIQUE: Contiguous axial images were obtained from the base of the skull through the vertex without intravenous contrast. COMPARISON:  None. FINDINGS: Brain: No evidence of acute infarction, hemorrhage, hydrocephalus, extra-axial collection or mass lesion/mass effect. Vascular: No hyperdense vessel or unexpected calcification. Skull: Normal. Negative for fracture or focal lesion. Sinuses/Orbits: No acute finding. Other: None IMPRESSION: No CT evidence for acute intracranial abnormality. Electronically Signed   By: Jasmine Pang M.D.   On: 05/18/2017 22:14    Procedures Procedures (including critical care time)  Medications Ordered in ED Medications  tetracaine (PONTOCAINE) 0.5 % ophthalmic solution 2 drop (2 drops Right Eye Given 05/18/17 2152)  diphenhydrAMINE (BENADRYL) injection 25 mg (25 mg Intravenous Given 05/18/17 2145)  prochlorperazine (COMPAZINE) injection 10 mg (10 mg Intravenous Given 05/18/17 2146)  dexamethasone (DECADRON) injection 10 mg (10 mg Intravenous Given 05/18/17 2146)  sodium chloride 0.9 % bolus 1,000 mL (1,000 mLs Intravenous New Bag/Given 05/18/17 2145)  ketorolac (TORADOL) 30 MG/ML  injection 30 mg (30 mg Intravenous Given 05/18/17 2311)     Initial Impression / Assessment and Plan / ED Course  I have reviewed the triage vital signs and the nursing notes.  Pertinent labs & imaging results that were available during my care of the patient were reviewed by me and considered in my medical decision making (see chart for details).  Clinical Course as of May 18 2310  Tue May 18, 2017  2254 Pt reports improvement of headache from 10/10 out of 5/10.  Left extremity symptoms have improved.    [EW]  2311 Ambulated by tech with normal gait.   [EW]    Clinical Course User Index [EW] Chad, Roth Ress, [place]    Afebrile, nontoxic patient with what is likely a complicated migraine  headache.  No red flags including head trauma, fevers, meningeal signs.  Left sided extremity symptoms improved with headache.  Migraine cocktail  given with relief.    D/C home with neurology follow up.  Discussed result, findings, treatment, and follow up  with patient.  Pt  given return precautions.  Pt verbalizes understanding and agrees with plan.     Final Clinical Impressions(s) / ED Diagnoses   Final diagnoses:  Complicated migraine    New Prescriptions New Prescriptions   BUTALBITAL-ACETAMINOPHEN-CAFFEINE (FIORICET, ESGIC) 50-325-40 MG TABLET    Take 1 tablet by mouth every 6 (six) hours as needed for headache.     Trixie DredgeWest, Nikeshia Keetch, PA-C 05/18/17 2313    Abelino DerrickMackuen, Courteney Lyn, MD 05/18/17 [phone]

## 2017-06-22 ENCOUNTER — Encounter: Payer: Self-pay | Admitting: Neurology

## 2017-07-29 ENCOUNTER — Encounter: Payer: Self-pay | Admitting: Neurology

## 2017-07-29 ENCOUNTER — Ambulatory Visit (INDEPENDENT_AMBULATORY_CARE_PROVIDER_SITE_OTHER): Payer: Medicaid Other | Admitting: Neurology

## 2017-07-29 DIAGNOSIS — G43019 Migraine without aura, intractable, without status migrainosus: Secondary | ICD-10-CM | POA: Diagnosis not present

## 2017-07-29 HISTORY — DX: Migraine without aura, intractable, without status migrainosus: G43.019

## 2017-07-29 MED ORDER — SUMATRIPTAN SUCCINATE 100 MG PO TABS: 100.0000 mg | ORAL_TABLET | Freq: Two times a day (BID) | ORAL | 3 refills | Status: DC | PRN

## 2017-07-29 MED ORDER — TOPIRAMATE 25 MG PO TABS: ORAL_TABLET | ORAL | 3 refills | Status: DC

## 2017-07-29 NOTE — Progress Notes (Signed)
Reason for visit: Migraine headache  Referring physician: Cora  [name] is a 26 y.o. male  History of present illness:  Mr. Juan Barnes is a 26 year old right-handed black male with a history of headaches dating back at least 2 years. The patient has had an exacerbation of his headaches within the last 2 months or so, [facility] 05/18/2017 with a ten-day history of headache. The patient indicates that when he gets his headache it may last for up to 2 weeks. He has headaches three quarters of the days of the month. Headache is usually in the right retro-orbital area and may spread around the head, more commonly on the right. Occasionally the headache may be on the left side. He may have photophobia, no phonophobia with the headache. He denies any nausea or vomiting. He may have some numbness on the face, right greater than left and sometimes involving the arms. He denies a true weakness, he denies any significant vision changes or cognitive slowing. May have some slight imbalance problems, but no vertigo. He denies any issues controlling the bowels or the bladder. He denies a family history of migraine. He indicates that drinking wine, cigarette smoke, and pork and beef seemed to activate the headache. He is taking Motrin if needed, he has an underlying anxiety disorder and caffeine worsens this. He indicates that he has been on propranolol in the past but this affected his sleep patterns. The patient does have chronic insomnia. He is sent to this office for an evaluation. CT scan of the brain was done in the emergency room and was unremarkable.  Past Medical History:  Diagnosis Date  . Anxiety attack   . Chicken pox   . Pollen allergy   . Tachycardia, paroxysmal [facility](HCC)     Past Surgical History:  Procedure Laterality Date  . NO PAST SURGERIES      Family History  Problem Relation Age of Onset  . Prostate cancer Father        3535  .  Post-traumatic stress disorder Father   . Anxiety disorder Father     Social history:  reports that he quit smoking about 2 years ago. His smoking use included Cigarettes. He has never used smokeless tobacco. He reports that he uses drugs, including Marijuana. He reports that he does not drink alcohol.  Medications:  Prior to Admission medications   Medication Sig Start Date End Date Taking? Authorizing Provider  clonazePAM (KLONOPIN) 0.5 MG tablet Take 1 tablet (0.5 mg total) by mouth 3 (three) times daily as needed for anxiety. 04/01/17  Yes Kuneff, Renee A, DO      Allergies  Allergen Reactions  . Banana Itching    Itchy/tight throat.     ROS:  Out of a complete 14 system review of symptoms, the patient complains only of the following symptoms, and all other reviewed systems are negative.  Weight loss Chest pain Ringing in the ears Eye pain Feeling hot, cold, increased thirst Joint pain, achy muscles Memory loss, headache, numbness, passing out Anxiety, not enough sleep, racing thoughts Insomnia  Blood pressure 124/79, pulse 77, height 5\' 11"  (1.803 m), weight 160 lb 8 oz (72.8 kg).  Physical Exam  General: The patient is alert and cooperative at the time of the examination.  Eyes: Pupils are equal, round, and reactive to light. Discs are flat bilaterally.  Neck: The neck is supple, no carotid bruits are noted.  Respiratory: The  respiratory examination is clear.  Cardiovascular: The cardiovascular examination reveals a regular rate and rhythm, no obvious murmurs or rubs are noted.  Skin: Extremities are without significant edema.  Neurologic Exam  Mental status: The patient is alert and oriented x 3 at the time of the examination. The patient has apparent normal recent and remote memory, with an apparently normal attention span and concentration ability.  Cranial nerves: Facial symmetry is present. There is good sensation of the face to pinprick and soft touch  bilaterally. The strength of the facial muscles and the muscles to head turning and shoulder shrug are normal bilaterally. Speech is well enunciated, no aphasia or dysarthria is noted. Extraocular movements are full. Visual fields are full. The tongue is midline, and the patient has symmetric elevation of the soft palate. No obvious hearing deficits are noted.  Motor: The motor testing reveals 5 over 5 strength of all 4 extremities. Good symmetric motor tone is noted throughout.  Sensory: Sensory testing is intact to pinprick, soft touch, vibration sensation, and position sense on all 4 extremities. No evidence of extinction is noted.  Coordination: Cerebellar testing reveals good finger-nose-finger and heel-to-shin bilaterally.  Gait and station: Gait is normal. Tandem gait is normal. Romberg is negative. No drift is seen.  Reflexes: Deep tendon reflexes are symmetric and normal bilaterally. Toes are downgoing bilaterally.    CT head 05/18/17:  IMPRESSION: No CT evidence for acute intracranial abnormality.  * CT scan images were reviewed online. I agree with the written report.    Assessment/Plan:  1. Migraine headache, intractable  The patient is to be placed on Topamax, working up on the dose. We will give Imitrex take if needed. The patient can take Advil as well with the headache. He will follow-up in 3 months, he will call for any dose adjustment of the medication.  [name]. Keith Andrell Tallman MD 07/29/2017 8:36 AM  [facility] [address] [place], [place] [id number]  Phone [phone] Fax [phone]

## 2017-07-29 NOTE — Patient Instructions (Signed)
   We will start topamax 25 mg daily for the headache and use Imitrex if need for the headache.  Topamax (topiramate) is a seizure medication that has an FDA approval for seizures and for migraine headache. Potential side effects of this medication include weight loss, cognitive slowing, tingling in the fingers and toes, and carbonated drinks will taste bad. If any significant side effects are noted on this drug, please contact our office.

## 2017-09-02 IMAGING — CT CT HEAD W/O CM
3 of 4 series · 16 of 47 positions shown, 19 images · non-contrast
Comparison: None.

CLINICAL DATA: Bilateral headache with nausea sharp pain behind the
eye

EXAM:
CT HEAD WITHOUT CONTRAST
TECHNIQUE: Contiguous axial images were obtained from the base of the skull
through the vertex without intravenous contrast.

[Series 2: head w/o · axial · non-contrast · 0.42mm/px · z∈[-54,+76]mm · 10 of 32 slices shown, 13 images]
[im 3/32  brain]
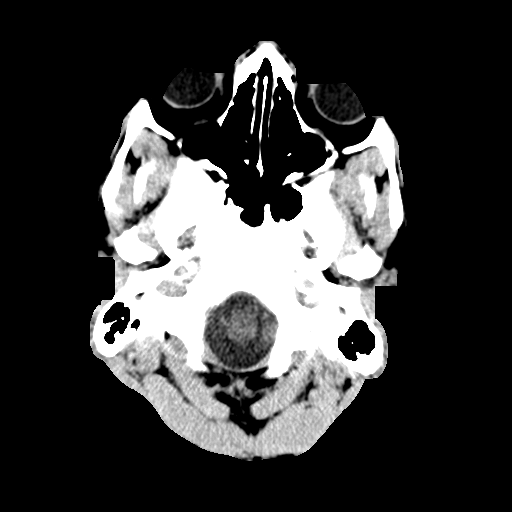
[im 3/32  bone]
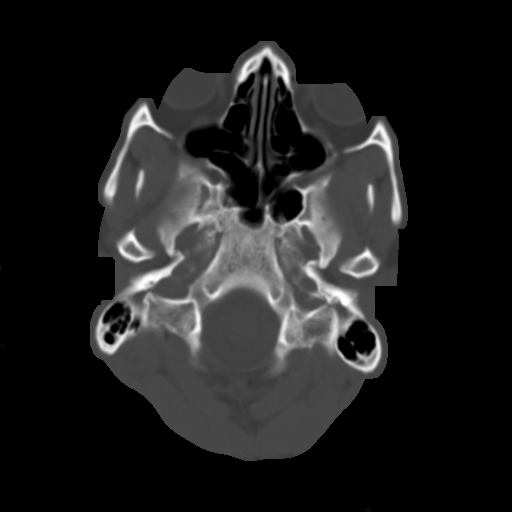
[im 5/32  brain]
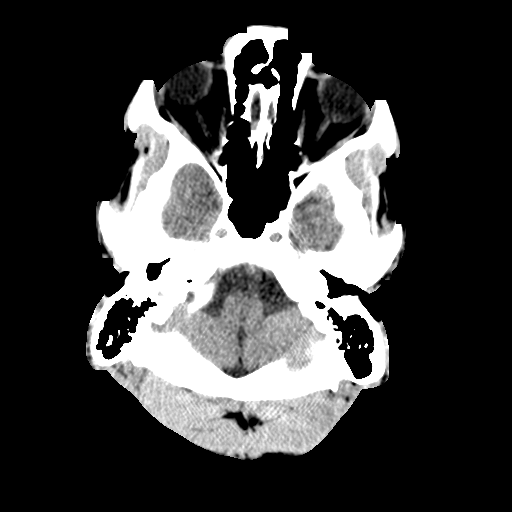
[im 9/32  brain]
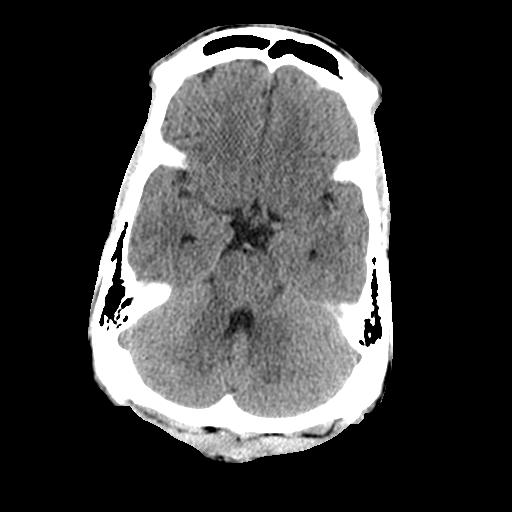
[im 12/32  brain]
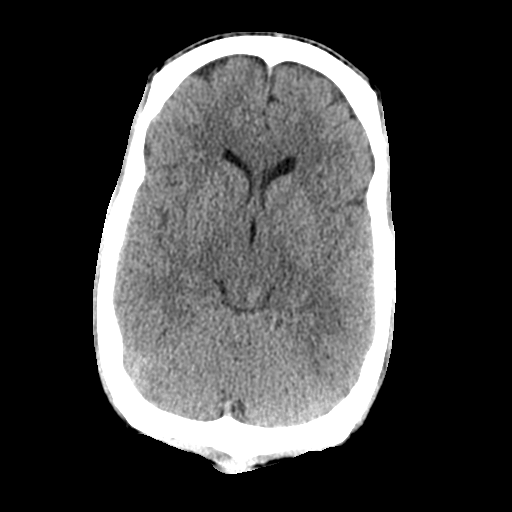
[im 14/32  brain]
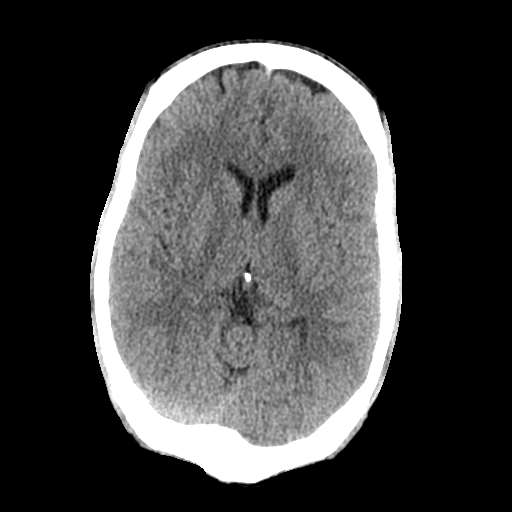
[im 14/32  bone]
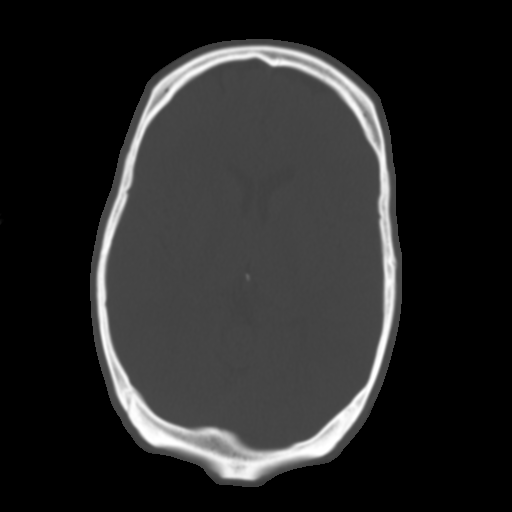
[im 18/32  brain]
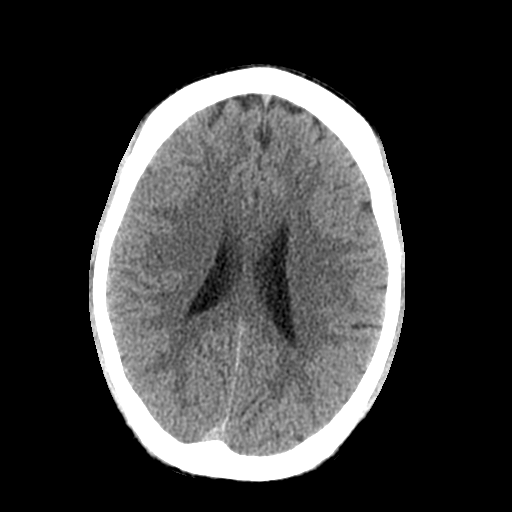
[im 20/32  brain]
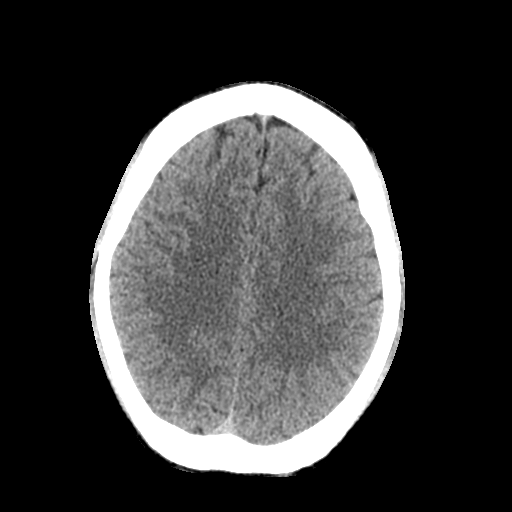
[im 23/32  brain]
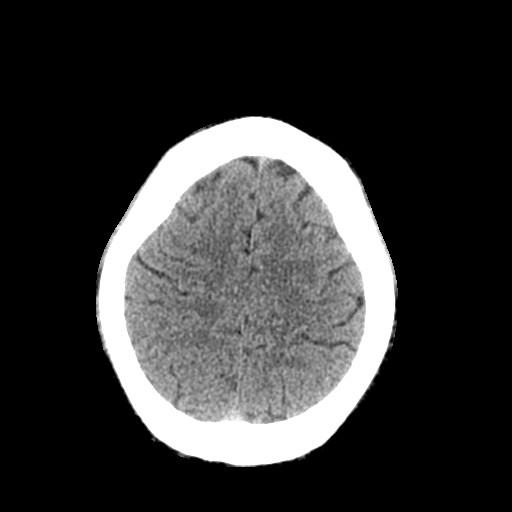
[im 27/32  brain]
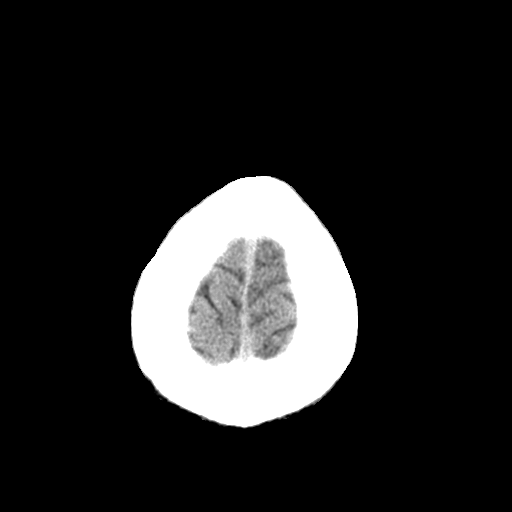
[im 27/32  bone]
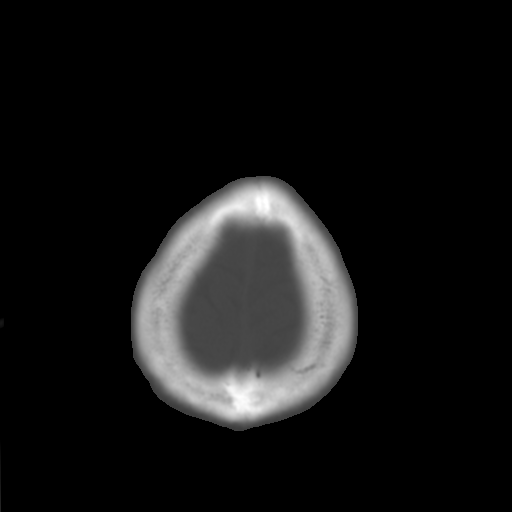
[im 29/32  brain]
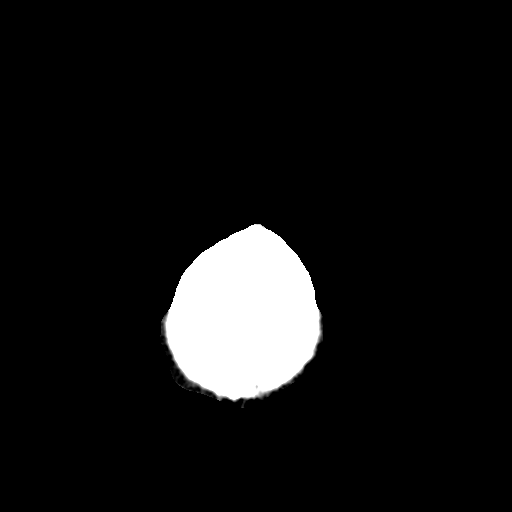

[Series 5: coronal · coronal · 0.30mm/px · 3 of 69 slices shown]
[im 23/69  brain]
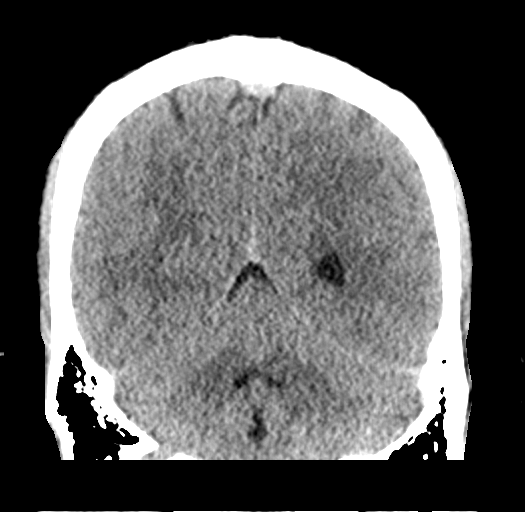
[im 31/69  brain]
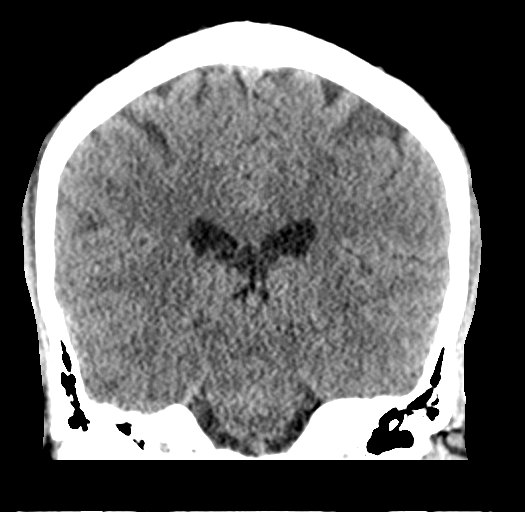
[im 38/69  brain]
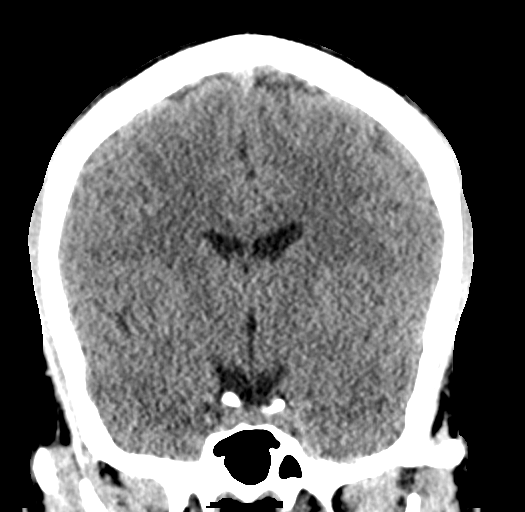

[Series 6: sagittal · sagittal · 0.29mm/px · 3 of 51 slices shown]
[im 17/51  brain]
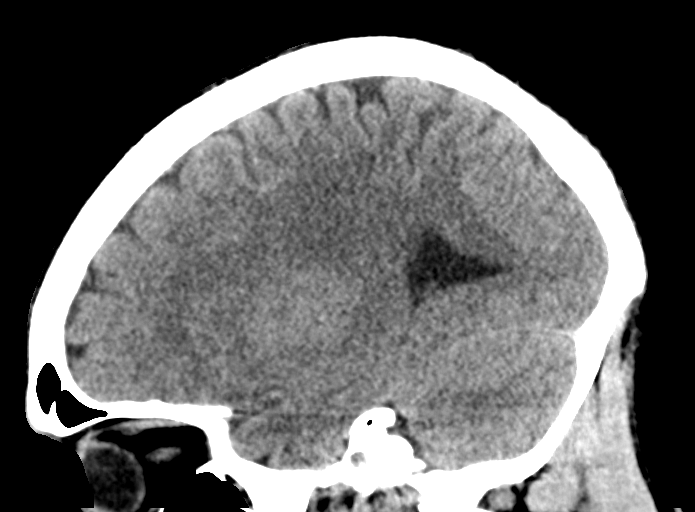
[im 26/51  brain]
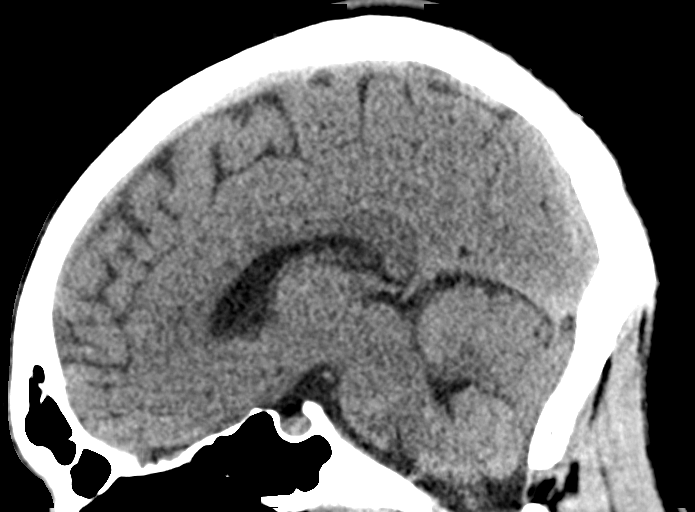
[im 34/51  brain]
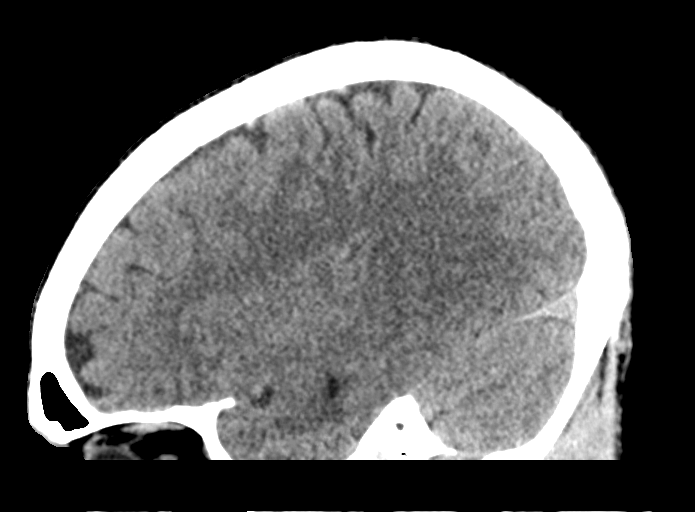

[16 of 47 positions shown; findings below may reference images not displayed]

FINDINGS: Brain: No evidence of acute infarction, hemorrhage, hydrocephalus,
extra-axial collection or mass lesion/mass effect.

Vascular: No hyperdense vessel or unexpected calcification.

Skull: Normal. Negative for fracture or focal lesion.

Sinuses/Orbits: No acute finding.

Other: None
IMPRESSION: No CT evidence for acute intracranial abnormality.

## 2017-09-23 ENCOUNTER — Emergency Department (HOSPITAL_COMMUNITY)
Admission: EM | Admit: 2017-09-23 | Discharge: 2017-09-23 | Disposition: A | Discharge status: Home / Self Care | Payer: Medicaid Other | Attending: Emergency Medicine | Admitting: Emergency Medicine

## 2017-09-23 ENCOUNTER — Encounter (HOSPITAL_COMMUNITY): Payer: Self-pay | Admitting: Emergency Medicine

## 2017-09-23 DIAGNOSIS — G4700 Insomnia, unspecified: Secondary | ICD-10-CM | POA: Diagnosis not present

## 2017-09-23 DIAGNOSIS — G43009 Migraine without aura, not intractable, without status migrainosus: Secondary | ICD-10-CM | POA: Diagnosis not present

## 2017-09-23 DIAGNOSIS — R51 Headache: Secondary | ICD-10-CM | POA: Diagnosis present

## 2017-09-23 DIAGNOSIS — Z87891 Personal history of nicotine dependence: Secondary | ICD-10-CM | POA: Insufficient documentation

## 2017-09-23 LAB — I-STAT CHEM 8, ED
BUN: 20 mg/dL (ref 6–20)
CREATININE: 1.1 mg/dL (ref 0.61–1.24)
Calcium, Ion: 1.16 mmol/L (ref 1.15–1.40)
Chloride: 102 mmol/L (ref 101–111)
GLUCOSE: 88 mg/dL (ref 65–99)
HEMATOCRIT: 46 % (ref 39.0–52.0)
HEMOGLOBIN: 15.6 g/dL (ref 13.0–17.0)
Potassium: 4 mmol/L (ref 3.5–5.1)
Sodium: 140 mmol/L (ref 135–145)
TCO2: 28 mmol/L (ref 22–32)

## 2017-09-23 MED ORDER — KETOROLAC TROMETHAMINE 15 MG/ML IJ SOLN
15.0000 mg | Freq: Once | INTRAMUSCULAR | Status: AC
  Administered 2017-09-23: 15 mg via INTRAVENOUS
  Filled 2017-09-23: qty 1

## 2017-09-23 MED ORDER — DIPHENHYDRAMINE HCL 12.5 MG/5ML PO ELIX
25.0000 mg | ORAL_SOLUTION | Freq: Once | ORAL | Status: AC
  Administered 2017-09-23: 25 mg via ORAL
  Filled 2017-09-23: qty 10

## 2017-09-23 MED ORDER — SODIUM CHLORIDE 0.9 % IV BOLUS (SEPSIS)
1000.0000 mL | Freq: Once | INTRAVENOUS | Status: AC
  Administered 2017-09-23: 1000 mL via INTRAVENOUS

## 2017-09-23 MED ORDER — ZOLPIDEM TARTRATE 5 MG PO TABS: 5.0000 mg | ORAL_TABLET | Freq: Every evening | ORAL | 0 refills | Status: DC | PRN

## 2017-09-23 MED ORDER — METOCLOPRAMIDE HCL 5 MG/ML IJ SOLN
10.0000 mg | Freq: Once | INTRAMUSCULAR | Status: AC
  Administered 2017-09-23: 10 mg via INTRAVENOUS
  Filled 2017-09-23: qty 2

## 2017-09-23 NOTE — ED Provider Notes (Signed)
[facility]-EMERGENCY DEPT Provider Note   CSN: [id number] Arrival date & time: 09/23/17  4696     History   Chief Complaint Chief Complaint  Patient presents with  . Headache  . Insomnia    HPI Juan Barnes is a 26 y.o. male.  HPI Pt comes in with cc of headaches and insomnia. Pt has hx of migraine (he thinks they are not migraines) and reports intermittent headaches that have been present for several months. Headache is throbbing and intermittent with associated worsening with light. Pt has no associated nausea, vomiting, seizures, loss of consciousness or new visual complains, weakness, numbness, dizziness or gait instability.  Pt also reports that he has insomnia for the last several days and unable to sleep the last 5 days despite starting trazodone. Pt denies any caffeine intake, energy drinks, drug use, behavioral change.   Past Medical History:  Diagnosis Date  . Anxiety attack   . Chicken pox   . Common migraine with intractable migraine 07/29/2017  . Pollen allergy   . Tachycardia, paroxysmal [facility])     Patient Active Problem List   Diagnosis Date Noted  . Common migraine with intractable migraine 07/29/2017  . Suicidal thoughts 01/21/2017  . Panic attacks 09/02/2016  . Pericardial effusion 07/28/2016  . Generalized anxiety disorder 07/28/2016  . Tachycardia, paroxysmal (HCC) 07/28/2016    Past Surgical History:  Procedure Laterality Date  . NO PAST SURGERIES         Home Medications    Prior to Admission medications   Medication Sig Start Date End Date Taking? Authorizing Provider  clonazePAM (KLONOPIN) 0.5 MG tablet Take 1 tablet (0.5 mg total) by mouth 3 (three) times daily as needed for anxiety. Patient taking differently: Take 0.5 mg by mouth daily.  04/01/17  Yes Kuneff, Renee A, DO  SUMAtriptan (IMITREX) 100 MG tablet Take 1 tablet (100 mg total) by mouth 2 (two) times daily as needed for migraine. May repeat in 2 hours if headache persists or  recurs. Patient not taking: Reported on 09/23/2017 07/29/17   York Spaniel, MD  topiramate (TOPAMAX) 25 MG tablet Take one tablet at night for one week, then take 2 tablets at night for one week, then take 3 tablets at night. Patient not taking: Reported on 09/23/2017 07/29/17   York Spaniel, MD  zolpidem (AMBIEN) 5 MG tablet Take 1 tablet (5 mg total) by mouth at bedtime as needed for sleep. 09/23/17   Derwood Kaplan, MD    Family History Family History  Problem Relation Age of Onset  . Prostate cancer Father        77  . Post-traumatic stress disorder Father   . Anxiety disorder Father     Social History Social History  Substance Use Topics  . Smoking status: Former Smoker    Types: Cigarettes    Quit date: 05/29/2015  . Smokeless tobacco: Never Used  . Alcohol use No     Allergies   Banana   Review of Systems Review of Systems  Constitutional: Positive for activity change.  Gastrointestinal: Negative for nausea.  Neurological: Positive for headaches.  Psychiatric/Behavioral: Positive for sleep disturbance.  All other systems reviewed and are negative.    Physical Exam Updated Vital Signs BP 116/80 (BP Location: Left Arm)   Pulse 68   Temp 97.8 F (36.6 C) (Oral)   Resp 18   SpO2 97%   Physical Exam  Constitutional: He is oriented to person, place, and time. He appears well-developed.  HENT:  Head: Atraumatic.  Neck: Neck supple.  Cardiovascular: Normal rate.   Pulmonary/Chest: Effort normal.  Abdominal: Soft. There is no tenderness.  Neurological: He is alert and oriented to person, place, and time.  Skin: Skin is warm.  Nursing note and vitals reviewed.    ED Treatments / Results  Labs (all labs ordered are listed, but only abnormal results are displayed) Labs Reviewed  I-STAT CHEM 8, ED    EKG  EKG Interpretation None       Radiology No results found.  Procedures Procedures (including critical care time)  Medications Ordered  in ED Medications  sodium chloride 0.9 % bolus 1,000 mL (0 mLs Intravenous Stopped 09/23/17 1328)  ketorolac (TORADOL) 15 MG/ML injection 15 mg (15 mg Intravenous Given 09/23/17 1144)  metoCLOPramide (REGLAN) injection 10 mg (10 mg Intravenous Given 09/23/17 1143)  diphenhydrAMINE (BENADRYL) 12.5 MG/5ML elixir 25 mg (25 mg Oral Given 09/23/17 1147)     Initial Impression / Assessment and Plan / ED Course  I have reviewed the triage vital signs and the nursing notes.  Pertinent labs & imaging results that were available during my care of the patient were reviewed by me and considered in my medical decision making (see chart for details).  Clinical Course as of Sep 23 1406  Thu Sep 23, 2017  1354 Upon reassessment, patient reports that the headache has resolved. She continued to have no neurologic complains. Strict return precautions discussed, pt will return to the ER if there is visual complains, seizures, altered mental status, loss of consciousness, dizziness, new focal weakness, or numbness.     [AN]    Clinical Course User Index [AN] Derwood Kaplan, MD    Pt comes in with cc of headache - which appear to be his chronic type headaches. Insomnia is new. We will give pt 6 ambiens. I stressed the need to continue seeing his pcp and explore the underlying cause for his insomnia, including life style changes. Pt will see pcp next week. Strict ER return precautions have been discussed, and patient is agreeing with the plan and is comfortable with the workup done and the recommendations from the ER.   Final Clinical Impressions(s) / ED Diagnoses   Final diagnoses:  Insomnia, unspecified type  Migraine without aura and without status migrainosus, not intractable    New Prescriptions Discharge Medication List as of 09/23/2017  1:29 PM    START taking these medications   Details  zolpidem (AMBIEN) 5 MG tablet Take 1 tablet (5 mg total) by mouth at bedtime as needed for sleep.,  Starting Thu 09/23/2017, Print         Derwood Kaplan, MD 09/23/17 1430

## 2017-09-23 NOTE — Discharge Instructions (Signed)
Take the ambien if needed. See your doctor as requested.

## 2017-09-23 NOTE — ED Notes (Signed)
Pt sts hasn't slept in 4 days, and is starting to feel really bad due to lack of sleep. He reports headache, photophobia, generalized aches, and pin and needles sensation all over. He sts he has tried OTC sleep medications as well as trazodone prescribed by his doctor without relief.

## 2017-09-23 NOTE — ED Triage Notes (Addendum)
Patient c/o intermittent headaches with light sensitivity for "months".  Patient adds that he hasnt been able to sleep in past 4 days. Reports getting tired or sleepy but cant sleep.   Patient adds that at night he gets "tunnel vision and lights get brighter than they really are".

## 2017-09-30 ENCOUNTER — Ambulatory Visit: Payer: BLUE CROSS/BLUE SHIELD | Admitting: Neurology

## 2017-10-22 ENCOUNTER — Emergency Department (HOSPITAL_COMMUNITY): Payer: Medicaid Other

## 2017-10-22 ENCOUNTER — Emergency Department (HOSPITAL_COMMUNITY)
Admission: EM | Admit: 2017-10-22 | Discharge: 2017-10-22 | Disposition: A | Discharge status: Home / Self Care | Payer: Medicaid Other | Attending: Emergency Medicine | Admitting: Emergency Medicine

## 2017-10-22 ENCOUNTER — Encounter (HOSPITAL_COMMUNITY): Payer: Self-pay | Admitting: *Deleted

## 2017-10-22 DIAGNOSIS — R0789 Other chest pain: Secondary | ICD-10-CM | POA: Insufficient documentation

## 2017-10-22 DIAGNOSIS — Z87891 Personal history of nicotine dependence: Secondary | ICD-10-CM | POA: Diagnosis not present

## 2017-10-22 DIAGNOSIS — R079 Chest pain, unspecified: Secondary | ICD-10-CM | POA: Diagnosis present

## 2017-10-22 DIAGNOSIS — Z79899 Other long term (current) drug therapy: Secondary | ICD-10-CM | POA: Insufficient documentation

## 2017-10-22 LAB — CBC
HCT: 43.7 % (ref 39.0–52.0)
Hemoglobin: 15.1 g/dL (ref 13.0–17.0)
MCH: 31.9 pg (ref 26.0–34.0)
MCHC: 34.6 g/dL (ref 30.0–36.0)
MCV: 92.2 fL (ref 78.0–100.0)
Platelets: 159 10*3/uL (ref 150–400)
RBC: 4.74 MIL/uL (ref 4.22–5.81)
RDW: 13.3 % (ref 11.5–15.5)
WBC: 3.2 10*3/uL — ABNORMAL LOW (ref 4.0–10.5)

## 2017-10-22 LAB — BASIC METABOLIC PANEL
Anion gap: 8 (ref 5–15)
BUN: 19 mg/dL (ref 6–20)
CO2: 28 mmol/L (ref 22–32)
Calcium: 9.4 mg/dL (ref 8.9–10.3)
Chloride: 102 mmol/L (ref 101–111)
Creatinine, Ser: 1.29 mg/dL — ABNORMAL HIGH (ref 0.61–1.24)
GFR calc Af Amer: >60 mL/min (ref >60)
GFR calc non Af Amer: >60 mL/min (ref >60)
Glucose, Bld: 97 mg/dL (ref 65–99)
Potassium: 3.8 mmol/L (ref 3.5–5.1)
Sodium: 138 mmol/L (ref 135–145)

## 2017-10-22 LAB — POCT I-STAT TROPONIN I
Troponin i, poc: 0 ng/mL (ref 0.00–0.08)
Troponin i, poc: 0 ng/mL (ref 0.00–0.08)

## 2017-10-22 NOTE — ED Provider Notes (Signed)
[facility]-EMERGENCY DEPT Provider Note   CSN: [id number] Arrival date & time: 10/22/17  1748     History   Chief Complaint Chief Complaint  Patient presents with  . Chest Pain    HPI Juan Barnes is a 26 y.o. male.  HPI   26 year old male with history of anxiety, paroxysmal tachycardia, recurrent headache presenting complaint of chest pain.  Patient has had intermittent left-sided chest pain ongoing for the past 3 weeks after he received a flu shot.  He describes his pain as a sharp sensation, nonradiating, sometimes worse with positional changes and has been waxing and waning.  Last episode of pain was approximately 5 minutes ago and lasting for a few seconds.  Furthermore he also reporting sensation to both of his legs for the same duration.  He reports some low back pain but attributed to some heavy lifting recently.  He denies having any fever, chills, lightheadedness, dizziness, nausea, vomiting, diaphoresis, pleuritic chest pain, or dyspnea.  No prior history of PE or DVT, no recent surgery, prolonged bedrest, or recent travel.  Pain is minimal at this time.  He is a former smoker.    Past Medical History:  Diagnosis Date  . Anxiety attack   . Chicken pox   . Common migraine with intractable migraine 07/29/2017  . Pollen allergy   . Tachycardia, paroxysmal [facility])     Patient Active Problem List   Diagnosis Date Noted  . Common migraine with intractable migraine 07/29/2017  . Suicidal thoughts 01/21/2017  . Panic attacks 09/02/2016  . Pericardial effusion 07/28/2016  . Generalized anxiety disorder 07/28/2016  . Tachycardia, paroxysmal (HCC) 07/28/2016    Past Surgical History:  Procedure Laterality Date  . NO PAST SURGERIES         Home Medications    Prior to Admission medications   Medication Sig Start Date End Date Taking? Authorizing Provider  clonazePAM (KLONOPIN) 0.5 MG tablet Take 1 tablet (0.5 mg total) by mouth 3 (three) times  daily as needed for anxiety. Patient taking differently: Take 0.5 mg by mouth daily.  04/01/17   Kuneff, Renee A, DO  SUMAtriptan (IMITREX) 100 MG tablet Take 1 tablet (100 mg total) by mouth 2 (two) times daily as needed for migraine. May repeat in 2 hours if headache persists or recurs. Patient not taking: Reported on 09/23/2017 07/29/17   York Spaniel, MD  topiramate (TOPAMAX) 25 MG tablet Take one tablet at night for one week, then take 2 tablets at night for one week, then take 3 tablets at night. Patient not taking: Reported on 09/23/2017 07/29/17   York Spaniel, MD  zolpidem (AMBIEN) 5 MG tablet Take 1 tablet (5 mg total) by mouth at bedtime as needed for sleep. 09/23/17   Derwood Kaplan, MD    Family History Family History  Problem Relation Age of Onset  . Prostate cancer Father        21  . Post-traumatic stress disorder Father   . Anxiety disorder Father     Social History Social History  Substance Use Topics  . Smoking status: Former Smoker    Types: Cigarettes    Quit date: 05/29/2015  . Smokeless tobacco: Never Used  . Alcohol use No     Allergies   Banana   Review of Systems Review of Systems  All other systems reviewed and are negative.    Physical Exam Updated Vital Signs BP 119/82 (BP Location: Right Arm)   Pulse 92  Temp 98.3 F (36.8 C) (Oral)   Resp 18   SpO2 97%   Physical Exam  Constitutional: He appears well-developed and well-nourished. No distress.  HENT:  Head: Atraumatic.  Eyes: Conjunctivae are normal.  Neck: Neck supple. No JVD present.  Cardiovascular: Normal rate, regular rhythm and intact distal pulses.   Pulmonary/Chest: Effort normal and breath sounds normal. No respiratory distress. He has no wheezes. He exhibits no tenderness.  Abdominal: Soft. Bowel sounds are normal. There is no tenderness.  Musculoskeletal: He exhibits no edema.  Lymphadenopathy:    He has no cervical adenopathy.  Neurological: He is alert.  Skin: No  rash noted.  Psychiatric: He has a normal mood and affect.  Nursing note and vitals reviewed.    ED Treatments / Results  Labs (all labs ordered are listed, but only abnormal results are displayed) Labs Reviewed  BASIC METABOLIC PANEL - Abnormal; Notable for the following:       Result Value   Creatinine, Ser 1.29 (*)    All other components within normal limits  CBC - Abnormal; Notable for the following:    WBC 3.2 (*)    All other components within normal limits  I-STAT TROPONIN, ED  POCT I-STAT TROPONIN I  I-STAT TROPONIN, ED  POCT I-STAT TROPONIN I    EKG  EKG Interpretation None     ED ECG REPORT   Date: 10/22/2017  Rate: 88  Rhythm: normal sinus rhythm  QRS Axis: normal  Intervals: normal  ST/T Wave abnormalities: nonspecific ST changes  Conduction Disutrbances:none  Narrative Interpretation:   Old EKG Reviewed: unchanged  I have personally reviewed the EKG tracing and agree with the computerized printout as noted.   Radiology Dg Chest 2 View  Result Date: 10/22/2017 CLINICAL DATA:  Chest pain beginning 3 weeks ago after flu shot. Leg pain. EXAM: CHEST  2 VIEW COMPARISON:  08/04/2016 FINDINGS: The heart size and mediastinal contours are within normal limits. Both lungs are clear. The visualized skeletal structures are unremarkable. IMPRESSION: No active cardiopulmonary disease. Electronically Signed   By: Elberta Fortisaniel  Boyle M.D.   On: 10/22/2017 18:24    Procedures Procedures (including critical care time)  Medications Ordered in ED Medications - No data to display   Initial Impression / Assessment and Plan / ED Course  I have reviewed the triage vital signs and the nursing notes.  Pertinent labs & imaging results that were available during my care of the patient were reviewed by me and considered in my medical decision making (see chart for details).     BP 119/82 (BP Location: Right Arm)   Pulse 92   Temp 98.3 F (36.8 C) (Oral)   Resp 18   SpO2  97%    Final Clinical Impressions(s) / ED Diagnoses   Final diagnoses:  Nonspecific chest pain    New Prescriptions New Prescriptions   No medications on file   9:04 PM Patient presents with chest pain atypical for ACS.  He is well-appearing, vital signs stable and in no acute discomfort.  10:04 PM EKG without acute concerning ischemic changes, normal delta troponin Spells are mostly reassuring.  Evidence of mild chronic kidney disease with creatinine of 1.29, and he has baseline.  I encourage patient to follow-up with his primary care provider for further management of his kidney function.  His chest x-ray is unremarkable.  At this time patient is stable for discharge, return precautions discussed.   Fayrene Helperran, Diesel Lina, PA-C 10/22/17 2207  Raeford Razor, MD 10/26/17 [phone]

## 2017-10-22 NOTE — ED Notes (Signed)
PT DISCHARGED. INSTRUCTIONS GIVEN. AAOX4. PT IN NO APPARENT DISTRESS WITH MILD PAIN. THE OPPORTUNITY TO ASK QUESTIONS WAS PROVIDED. 

## 2017-10-22 NOTE — ED Triage Notes (Signed)
Pt complains of chest pain x 3 weeks. Pt states pain began after getting flu shot. Pt denies shortness of breath.

## 2017-11-02 ENCOUNTER — Ambulatory Visit: Payer: Medicaid Other | Admitting: Adult Health

## 2018-02-06 IMAGING — CR DG CHEST 2V
2 series · 2 of 2 positions shown · non-contrast
Comparison: 08/04/2016

CLINICAL DATA: Chest pain beginning 3 weeks ago after flu shot. Leg
pain.

EXAM:
CHEST  2 VIEW

[w chest pa]
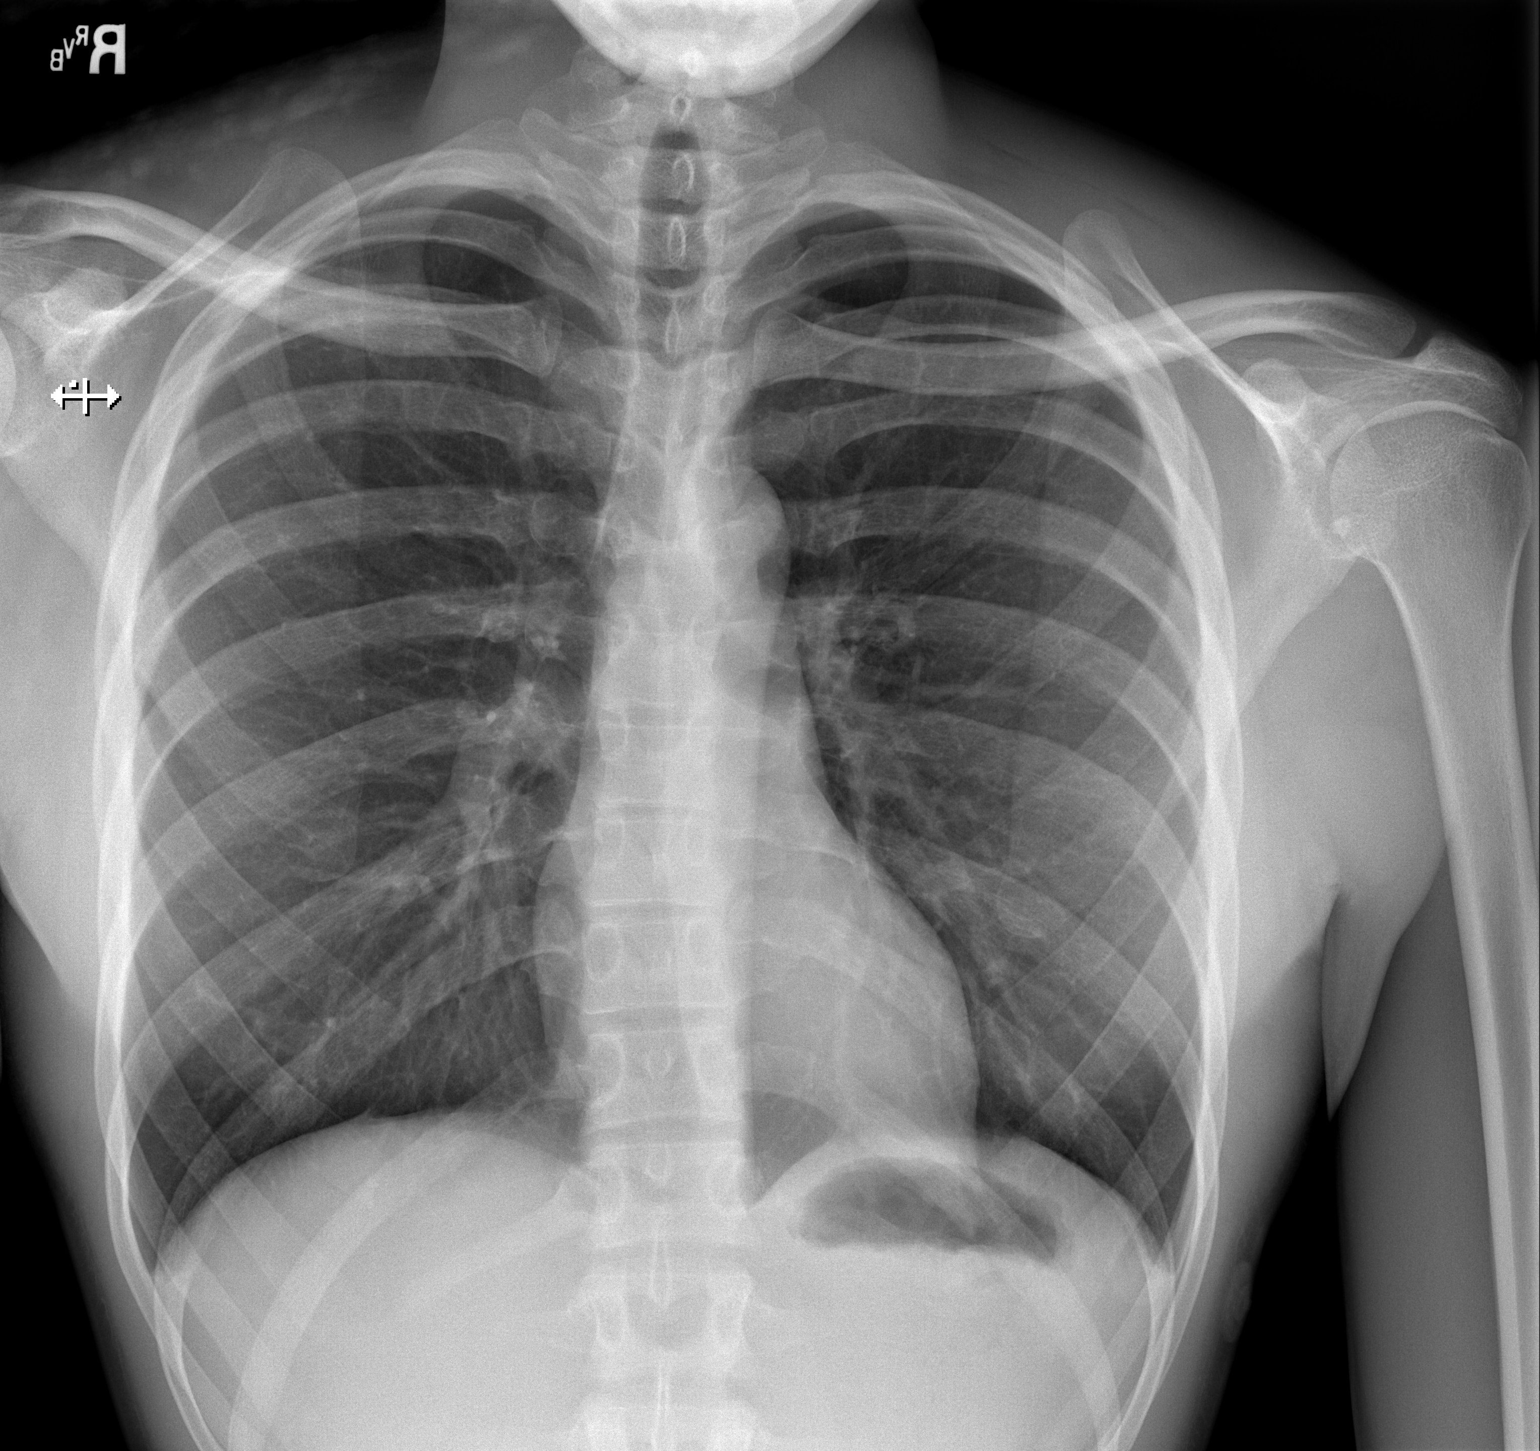

[w chest lat]
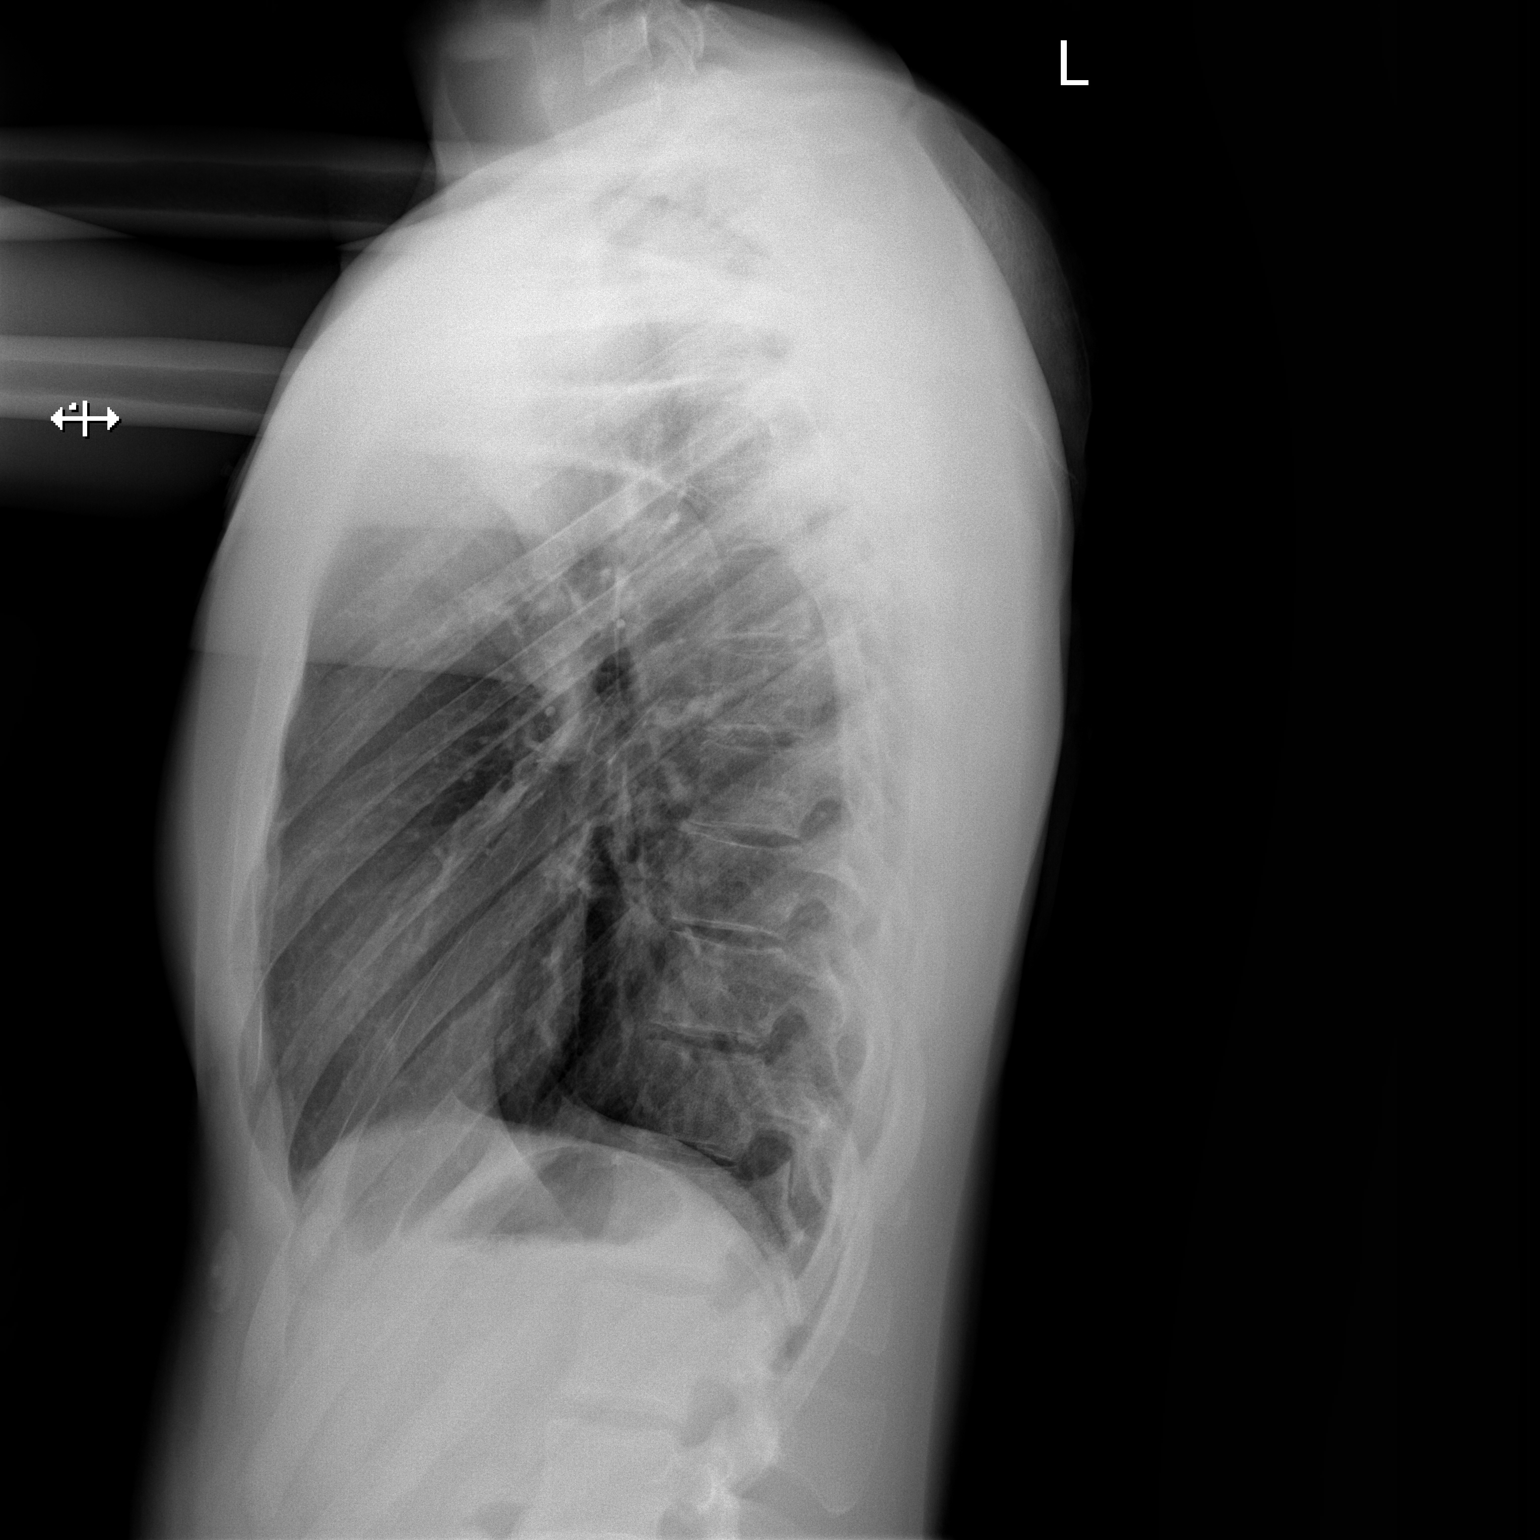

[2 of 2 positions shown; findings below may reference images not displayed]

FINDINGS: The heart size and mediastinal contours are within normal limits.
Both lungs are clear. The visualized skeletal structures are
unremarkable.
IMPRESSION: No active cardiopulmonary disease.

## 2018-02-23 ENCOUNTER — Encounter (HOSPITAL_COMMUNITY): Payer: Self-pay

## 2018-02-23 ENCOUNTER — Emergency Department (HOSPITAL_COMMUNITY): Payer: Medicaid Other

## 2018-02-23 ENCOUNTER — Emergency Department (HOSPITAL_COMMUNITY)
Admission: EM | Admit: 2018-02-23 | Discharge: 2018-02-23 | Disposition: A | Discharge status: Home / Self Care | Payer: Medicaid Other | Attending: Emergency Medicine | Admitting: Emergency Medicine

## 2018-02-23 DIAGNOSIS — Z87891 Personal history of nicotine dependence: Secondary | ICD-10-CM | POA: Diagnosis not present

## 2018-02-23 DIAGNOSIS — N50811 Right testicular pain: Secondary | ICD-10-CM | POA: Insufficient documentation

## 2018-02-23 DIAGNOSIS — R1031 Right lower quadrant pain: Secondary | ICD-10-CM

## 2018-02-23 DIAGNOSIS — N5082 Scrotal pain: Secondary | ICD-10-CM

## 2018-02-23 LAB — URINALYSIS, ROUTINE W REFLEX MICROSCOPIC
BILIRUBIN URINE: NEGATIVE
Glucose, UA: NEGATIVE mg/dL
Hgb urine dipstick: NEGATIVE
Ketones, ur: NEGATIVE mg/dL
Leukocytes, UA: NEGATIVE
Nitrite: NEGATIVE
PH: 6 (ref 5.0–8.0)
Protein, ur: NEGATIVE mg/dL
SPECIFIC GRAVITY, URINE: 1.028 (ref 1.005–1.030)

## 2018-02-23 MED ORDER — HYDROCODONE-ACETAMINOPHEN 5-325 MG PO TABS
1.0000 | ORAL_TABLET | Freq: Once | ORAL | Status: AC
  Administered 2018-02-23: 1 via ORAL
  Filled 2018-02-23: qty 1

## 2018-02-23 NOTE — ED Notes (Signed)
US at bedside

## 2018-02-23 NOTE — ED Provider Notes (Signed)
[facility]-EMERGENCY DEPT Provider Note   CSN: [id number] Arrival date & time: 02/23/18  0252     History   Chief Complaint Chief Complaint  Patient presents with  . Groin Pain    HPI Juan Barnes is a 27 y.o. male.  The history is provided by the patient.  Groin Pain  This is a new problem. The current episode started more than 2 days ago. The problem occurs daily. The problem has been gradually worsening. Associated symptoms include abdominal pain. Exacerbated by: Sitting. Nothing relieves the symptoms.  Patient reports onset of right testicle pain last week.  He reports he was seen in urgent care on February 22.  He was diagnosed with epididymitis.  He was placed on doxycycline. Since that time he feels that the pain is worsening.  He reports some swelling in his groin.  No fever or vomiting.  He has had some intermittent back pain as well.  Denies penile discharge.  Denies any recent exposures to STDs. Denies trauma to his groin  Past Medical History:  Diagnosis Date  . Anxiety attack   . Chicken pox   . Common migraine with intractable migraine 07/29/2017  . Pollen allergy   . Tachycardia, paroxysmal [facility](HCC)     Patient Active Problem List   Diagnosis Date Noted  . Common migraine with intractable migraine 07/29/2017  . Suicidal thoughts 01/21/2017  . Panic attacks 09/02/2016  . Pericardial effusion 07/28/2016  . Generalized anxiety disorder 07/28/2016  . Tachycardia, paroxysmal (HCC) 07/28/2016    Past Surgical History:  Procedure Laterality Date  . NO PAST SURGERIES         Home Medications    Prior to Admission medications   Medication Sig Start Date End Date Taking? Authorizing Provider  doxycycline (VIBRA-TABS) 100 MG tablet Take 100 mg by mouth 2 (two) times daily.   Yes [provider]  ibuprofen (ADVIL,MOTRIN) 600 MG tablet Take 600 mg by mouth every 6 (six) hours as needed for moderate pain.   Yes [provider]    Family History Family History  Problem Relation Age of Onset  . Prostate cancer Father        835  . Post-traumatic stress disorder Father   . Anxiety disorder Father     Social History Social History   Tobacco Use  . Smoking status: Former Smoker    Types: Cigarettes    Last attempt to quit: 05/29/2015    Years since quitting: 2.7  . Smokeless tobacco: Never Used  Substance Use Topics  . Alcohol use: No  . Drug use: Yes    Types: Marijuana     Allergies   Banana   Review of Systems Review of Systems  Constitutional: Negative for fever.  Gastrointestinal: Positive for abdominal pain.  Genitourinary: Positive for testicular pain. Negative for discharge and penile pain.  All other systems reviewed and are negative.    Physical Exam Updated Vital Signs BP 127/81 (BP Location: Right Arm)   Pulse 88   Temp 98.7 F (37.1 C) (Oral)   Resp 18   SpO2 100%   Physical Exam  CONSTITUTIONAL: Well developed/well nourished HEAD: Normocephalic/atraumatic EYES: EOMI/PERRL ENMT: Mucous membranes moist NECK: supple no meningeal signs SPINE/BACK:entire spine nontender LUNGS:  no apparent distress ABDOMEN: soft, nontender GU:no cva tenderness, right testicle tender to palpation, testicle appears retracted and mildly edematous when compared to left.  No inguinal hernia.  No erythema or crepitus.  Male nurse tech chaperone  present NEURO: Pt is awake/alert/appropriate, moves all extremitiesx4.    EXTREMITIES:  full ROM SKIN: warm, color normal PSYCH: no abnormalities of mood noted, alert and oriented to situation  ED Treatments / Results  Labs (all labs ordered are listed, but only abnormal results are displayed) Labs Reviewed  URINALYSIS, ROUTINE W REFLEX MICROSCOPIC  GC/CHLAMYDIA PROBE AMP ([place]) NOT AT [facility]    EKG  EKG Interpretation None       Radiology US Scrotum  Result Date: 02/23/2018 CLINICAL DATA:  27 year old male with testicular pain  x4 days. Right greater than left. EXAM: SCROTAL ULTRASOUND DOPPLER ULTRASOUND OF THE TESTICLES TECHNIQUE: Complete ultrasound examination of the testicles, epididymis, and other scrotal structures was performed. Color and spectral Doppler ultrasound were also utilized to evaluate blood flow to the testicles. COMPARISON:  None. FINDINGS: Right testicle Measurements: 3.8 x 2.2 x 3.1 cm. No mass or microlithiasis visualized. Left testicle Measurements: 4.6 x 2.4 x 2.9 cm. No mass or microlithiasis visualized. Right epididymis: Normal in size and appearance. There is a 4 mm right epididymal head cyst. Left epididymis:  Normal in size and appearance. Hydrocele:  Small bilateral hydroceles. Varicocele:  None visualized. Pulsed Doppler interrogation of both testes demonstrates normal low resistance arterial and venous waveforms bilaterally. IMPRESSION: 1. Unremarkable testicles with doppler detected flow bilaterally. 2. Small bilateral hydroceles. Electronically Signed   By: Elgie Collard M.D.   On: 02/23/2018 06:57   US Scrotum Doppler  Result Date: 02/23/2018 CLINICAL DATA:  27 year old male with testicular pain x4 days. Right greater than left. EXAM: SCROTAL ULTRASOUND DOPPLER ULTRASOUND OF THE TESTICLES TECHNIQUE: Complete ultrasound examination of the testicles, epididymis, and other scrotal structures was performed. Color and spectral Doppler ultrasound were also utilized to evaluate blood flow to the testicles. COMPARISON:  None. FINDINGS: Right testicle Measurements: 3.8 x 2.2 x 3.1 cm. No mass or microlithiasis visualized. Left testicle Measurements: 4.6 x 2.4 x 2.9 cm. No mass or microlithiasis visualized. Right epididymis: Normal in size and appearance. There is a 4 mm right epididymal head cyst. Left epididymis:  Normal in size and appearance. Hydrocele:  Small bilateral hydroceles. Varicocele:  None visualized. Pulsed Doppler interrogation of both testes demonstrates normal low resistance arterial and  venous waveforms bilaterally. IMPRESSION: 1. Unremarkable testicles with doppler detected flow bilaterally. 2. Small bilateral hydroceles. Electronically Signed   By: Elgie Collard M.D.   On: 02/23/2018 06:57    Procedures Procedures (including critical care time)  Medications Ordered in ED Medications  HYDROcodone-acetaminophen (NORCO/VICODIN) 5-325 MG per tablet 1 tablet (1 tablet Oral Given 02/23/18 0533)     Initial Impression / Assessment and Plan / ED Course  I have reviewed the triage vital signs and the nursing notes.  Pertinent labs   results that were available during my care of the patient were reviewed by me and considered in my medical decision making (see chart for details).     5:53 AM Pt with worsening right testicle pain, already on treatment for epididymitis Due to Worsening symptoms, testicular ultrasound ordered.  Will need to rule out torsion 7:13 AM Ultrasound imaging negative for torsion.  Patient stable at this time.  He reports he did receive Rocephin injection at urgent care as well as 10 days of doxycycline.  Advised to continue this. Final Clinical Impressions(s) / ED Diagnoses   Final diagnoses:  Scrotum pain    ED Discharge Orders    None       Zadie Rhine, MD 02/23/18 [phone]

## 2018-02-23 NOTE — ED Triage Notes (Signed)
Pt complains of groin pain that radiates through his scrotum and around his back

## 2018-02-24 LAB — GC/CHLAMYDIA PROBE AMP (~~LOC~~) NOT AT ARMC
Chlamydia: NEGATIVE
Neisseria Gonorrhea: NEGATIVE

## 2020-05-16 ENCOUNTER — Encounter (HOSPITAL_COMMUNITY): Payer: Self-pay | Admitting: Emergency Medicine

## 2020-05-16 ENCOUNTER — Other Ambulatory Visit: Payer: Self-pay

## 2020-05-16 ENCOUNTER — Emergency Department (HOSPITAL_COMMUNITY)
Admission: EM | Admit: 2020-05-16 | Discharge: 2020-05-16 | Disposition: A | Discharge status: Home / Self Care | Payer: Medicaid Other | Attending: Emergency Medicine | Admitting: Emergency Medicine

## 2020-05-16 DIAGNOSIS — W2209XA Striking against other stationary object, initial encounter: Secondary | ICD-10-CM | POA: Insufficient documentation

## 2020-05-16 DIAGNOSIS — Y9259 Other trade areas as the place of occurrence of the external cause: Secondary | ICD-10-CM | POA: Insufficient documentation

## 2020-05-16 DIAGNOSIS — Z87891 Personal history of nicotine dependence: Secondary | ICD-10-CM | POA: Insufficient documentation

## 2020-05-16 DIAGNOSIS — Y939 Activity, unspecified: Secondary | ICD-10-CM | POA: Insufficient documentation

## 2020-05-16 DIAGNOSIS — S0990XA Unspecified injury of head, initial encounter: Secondary | ICD-10-CM | POA: Diagnosis present

## 2020-05-16 DIAGNOSIS — S060X0A Concussion without loss of consciousness, initial encounter: Secondary | ICD-10-CM | POA: Insufficient documentation

## 2020-05-16 DIAGNOSIS — Y990 Civilian activity done for income or pay: Secondary | ICD-10-CM | POA: Insufficient documentation

## 2020-05-16 NOTE — ED Provider Notes (Signed)
[facility]ENCY DEPT [facility] Emergency Department Provider Note MRN:  [id number]  Arrival date & time: 05/16/20     Chief Complaint   Head Injury   History of Present Illness   Juan Barnes is a 29 y.o. year-old male with no pertinent past medical history presenting to the ED with chief complaint of head injury.  Patient has had 2 head injuries over the past 3 days.  Hit front of head while at work, today stood up and hit front of head against a metal pole.  No loss of consciousness, no nausea or vomiting, has had some fogginess since the event but otherwise no complaints.  No other injuries.  Review of Systems  A complete 10 system review of systems was obtained and all systems are negative except as noted in the HPI and PMH.   Patient's Health History    Past Medical History:  Diagnosis Date  . Anxiety attack   . Chicken pox   . Common migraine with intractable migraine 07/29/2017  . Pollen allergy   . Tachycardia, paroxysmal [facility])     Past Surgical History:  Procedure Laterality Date  . NO PAST SURGERIES      Family History  Problem Relation Age of Onset  . Prostate cancer Father        2  . Post-traumatic stress disorder Father   . Anxiety disorder Father     Social History   Socioeconomic History  . Marital status: Married    Spouse name: Not on file  . Number of children: 2  . Years of education: College  . Highest education level: Not on file  Occupational History  . Occupation: Unemployed  Tobacco Use  . Smoking status: Former Smoker    Types: Cigarettes    Quit date: 05/29/2015    Years since quitting: 4.9  . Smokeless tobacco: Never Used  Substance and Sexual Activity  . Alcohol use: No  . Drug use: Yes    Types: Marijuana  . Sexual activity: Yes    Partners: Female    Birth control/protection: None  Other Topics Concern  . Not on file  Social History Narrative   Engaged to [place]. ( 1 child Camilla).    Some college. Distribution  Specialist.    Takes a daily vitamin.    Wears seatbelt   Exercise routinely.    Smoke detector in the home.    Firearms (locked) in the home.    Feels safe in relationships.    Right handed   Caffeine use: Green tea, caffeine free   Social Determinants of Health   Financial Resource Strain:   . Difficulty of Paying Living Expenses:   Food Insecurity:   . Worried About Programme researcher, broadcasting/film/video in the Last Year:   . Barista in the Last Year:   Transportation Needs:   . Freight forwarder (Medical):   Marland Kitchen Lack of Transportation (Non-Medical):   Physical Activity:   . Days of Exercise per Week:   . Minutes of Exercise per Session:   Stress:   . Feeling of Stress :   Social Connections:   . Frequency of Communication with Friends and Family:   . Frequency of Social Gatherings with Friends and Family:   . Attends Religious Services:   . Active Member of Clubs or Organizations:   . Attends Banker Meetings:   Marland Kitchen Marital Status:   Intimate Partner Violence:   . Fear of Current or Ex-Partner:   .  Emotionally Abused:   Marland Kitchen Physically Abused:   . Sexually Abused:      Physical Exam   Vitals:   05/16/20 1358  BP: 136/84  Pulse: 82  Resp: 18  Temp: 98.9 F (37.2 C)  SpO2: 100%    CONSTITUTIONAL: Well-appearing, NAD NEURO:  Alert and oriented x 3, normal and symmetric strength and sensation, normal coordination, normal speech, normal gait EYES:  eyes equal and reactive ENT/NECK:  no LAD, no JVD CARDIO: Regular rate, well-perfused, normal S1 and S2 PULM:  CTAB no wheezing or rhonchi GI/GU:  normal bowel sounds, non-distended, non-tender MSK/SPINE:  No gross deformities, no edema SKIN:  no rash, atraumatic PSYCH:  Appropriate speech and behavior  *Additional and/or pertinent findings included in MDM below  Diagnostic and Interventional Summary    EKG Interpretation  Date/Time:    Ventricular Rate:    PR Interval:    QRS Duration:   QT Interval:     QTC Calculation:   R Axis:     Text Interpretation:        Labs Reviewed - No data to display  No orders to display    Medications - No data to display   Procedures  /  Critical Care Procedures  ED Course and Medical Decision Making  I have reviewed the triage vital signs, the nursing notes, and pertinent available records from the EMR.  Listed above are laboratory and imaging tests that I personally ordered, reviewed, and interpreted and then considered in my medical decision making (see below for details).      Reassuring neurological exam, normal vital signs, small bruise to the forehead, no hematoma, given the minor nature of the injury and the location of the injury I do not feel that CNS imaging is necessary, very low concern for CNS injury.  Possible mild concussion given the trouble concentrating, provided reassurance and advised mental and physical rest.    Barth Kirks. Sedonia Small, MD [place] [email or web address]  Final Clinical Impressions(s) / ED Diagnoses     ICD-10-CM   1. Concussion without loss of consciousness, initial encounter  S06.0X0A     ED Discharge Orders    None       Discharge Instructions Discussed with and Provided to Patient:     Discharge Instructions     You were evaluated in the Emergency Department and after careful evaluation, we did not find any emergent condition requiring admission or further testing in the hospital.  Your exam/testing today was overall reassuring.  Your symptoms seem to be due to a mild concussion.  As discussed, please practice mental and physical rest for the next 2 days and if feeling back to normal can slow resume normal daily activities.  Please return to the Emergency Department if you experience any worsening of your condition.  We encourage you to follow up with a primary care provider.  Thank you for allowing [place] to be a part of your care.       Maudie Flakes, MD 05/16/20 1459

## 2020-05-16 NOTE — Discharge Instructions (Addendum)
You were evaluated in the Emergency Department and after careful evaluation, we did not find any emergent condition requiring admission or further testing in the hospital.  Your exam/testing today was overall reassuring.  Your symptoms seem to be due to a mild concussion.  As discussed, please practice mental and physical rest for the next 2 days and if feeling back to normal can slow resume normal daily activities.  Please return to the Emergency Department if you experience any worsening of your condition.  We encourage you to follow up with a primary care provider.  Thank you for allowing [place] to be a part of your care.

## 2020-05-16 NOTE — ED Triage Notes (Signed)
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 Denies LOC or taking blood thinners.

## 2020-08-27 ENCOUNTER — Ambulatory Visit: Payer: BLUE CROSS/BLUE SHIELD | Admitting: Cardiology

## 2020-08-27 ENCOUNTER — Encounter: Payer: Self-pay | Admitting: Cardiology

## 2020-08-27 ENCOUNTER — Other Ambulatory Visit: Payer: Self-pay

## 2020-08-27 VITALS — BP 141/85 | HR 103 | Resp 16 | Ht 71.0 in

## 2020-08-27 DIAGNOSIS — R0602 Shortness of breath: Secondary | ICD-10-CM | POA: Insufficient documentation

## 2020-08-27 DIAGNOSIS — R0609 Other forms of dyspnea: Secondary | ICD-10-CM

## 2020-08-27 DIAGNOSIS — R0789 Other chest pain: Secondary | ICD-10-CM

## 2020-08-27 DIAGNOSIS — R42 Dizziness and giddiness: Secondary | ICD-10-CM

## 2020-08-27 DIAGNOSIS — G90A Postural orthostatic tachycardia syndrome (POTS): Secondary | ICD-10-CM | POA: Insufficient documentation

## 2020-08-27 NOTE — Progress Notes (Signed)
Patient referred by [place], Julie M, NP for dizziness, palpitations  Subjective:   Juan Barnes, male    DOB: 08-21-1991, 29 y.o.   MRN: [id number]   Chief Complaint  Patient presents with  . Shortness of Breath  . Dizziness  . New Patient (Initial Visit)     HPI  29 y.o. African-American male with history of anxiety, referred for evaluation of dizziness and palpitations.  Patient is a Administrator by profession, drives several hours every day.  He does little in terms of physical activity or exercise outside of work.  He tells me that he had either a cardiac MRI or chest x-ray several years ago and was told to have fluid around his heart.  He has had left-sided chest pain, lasting for anywhere from few seconds to few minutes.  There is loose correlation with exertion.  More recently, is experienced exertional dyspnea.  He also reports dizziness with minimal activity, especially while standing.  He denies any syncopal episodes.   Past Medical History:  Diagnosis Date  . Anxiety attack   . Chicken pox   . Common migraine with intractable migraine 07/29/2017  . Pollen allergy   . Tachycardia, paroxysmal [facility])      Past Surgical History:  Procedure Laterality Date  . NO PAST SURGERIES       Social History   Tobacco Use  Smoking Status Former Smoker  . Types: Cigarettes  . Quit date: 05/29/2015  . Years since quitting: 5.2  Smokeless Tobacco Never Used    Social History   Substance and Sexual Activity  Alcohol Use No     Family History  Problem Relation Age of Onset  . Prostate cancer Father        31  . Post-traumatic stress disorder Father   . Anxiety disorder Father      Current Outpatient Medications on File Prior to Visit  Medication Sig Dispense Refill  . doxycycline (VIBRA-TABS) 100 MG tablet Take 100 mg by mouth 2 (two) times daily.    Marland Kitchen ibuprofen (ADVIL,MOTRIN) 600 MG tablet Take 600 mg by mouth every 6 (six) hours as needed for moderate pain.      No current facility-administered medications on file prior to visit.    Cardiovascular and other pertinent studies:  EKG 08/27/2020: Sinus rhythm 87 bpm Normal EKG   Echocardiogram 2017: - Normal study with the exception of a trivial pericardial effusion  noted lateral to heart.   Recent labs: 08/06/2020: Glucose 111. BUN/Cr 21/1.22. eGFR >90. Na/K 137/4.0. Rest of the CMP normal H/H 14/44. MCV 94. Platelets 167  TSH 2.0 normal   Review of Systems  Cardiovascular: Positive for chest pain and dyspnea on exertion. Negative for leg swelling, palpitations and syncope.  Neurological: Positive for dizziness and light-headedness.         Vitals:   08/27/20 0958 08/27/20 0959  BP:    Pulse:    Resp:    SpO2: 98% 98%   Orthostatic VS for the past 72 hrs (Last 3 readings):  Orthostatic BP Patient Position BP Location Cuff Size Orthostatic Pulse  08/27/20 0959 (!) 142/93 Standing Left Arm Normal 130  08/27/20 0958 (!) 142/93 Sitting Left Arm Normal 114  08/27/20 0957 155/82 Supine Left Arm Normal 101     Body mass index is 20.5 kg/m. There were no vitals filed for this visit.   Objective:   Physical Exam Vitals and nursing note reviewed.  Constitutional:  General: He is not in acute distress. Neck:     Vascular: No JVD.  Cardiovascular:     Rate and Rhythm: Regular rhythm. Tachycardia present.     Pulses: Normal pulses.     Heart sounds: Normal heart sounds. No murmur heard.   Pulmonary:     Effort: Pulmonary effort is normal.     Breath sounds: Normal breath sounds. No wheezing or rales.          Assessment & Recommendations:   29 y.o. African-American male with history of anxiety, referred for evaluation of dizziness and palpitations.  Chest pain, shortness of breath: Recommend echocardiogram and exercise treadmill stress test. Independently reviewed prior studies.  Echocardiogram in 2017 had showed trivial pericardial effusion, which is  likely clinically nonsignificant.  Low suspicion for large pericardial effusion based on physical exam.  POTS: Heart rate increased from 100 to 130 bpm with standing.  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  Encourage increase hydration, wearing compression stockings, and regular physical activity.  Elevated blood pressure without diagnosis of hypertension:  Blood pressure elevated today.  It was completely normal at recent PCP visit.  I suspect this is related to whitecoat hypertension and anxiety.  I will not start him on any antihypertensive medication today.  We will recheck at next visit  Follow-up in 4 weeks to reassess symptoms.  Thank you for referring the patient to [place]. Please feel free to contact with any questions.   Nigel Mormon, MD Pager: [phone] Office: [phone]

## 2020-08-27 NOTE — Progress Notes (Signed)
Your patient 

## 2020-08-30 ENCOUNTER — Ambulatory Visit: Payer: Medicaid Other | Admitting: Cardiology

## 2020-09-05 ENCOUNTER — Other Ambulatory Visit: Payer: Self-pay

## 2020-09-05 ENCOUNTER — Ambulatory Visit: Payer: Medicaid Other

## 2020-09-05 DIAGNOSIS — R0609 Other forms of dyspnea: Secondary | ICD-10-CM

## 2020-09-06 ENCOUNTER — Ambulatory Visit: Payer: Medicaid Other | Admitting: Cardiology

## 2020-09-13 ENCOUNTER — Other Ambulatory Visit (HOSPITAL_COMMUNITY)
Admission: RE | Admit: 2020-09-13 | Discharge: 2020-09-13 | Disposition: A | Discharge status: Home / Self Care | Payer: Medicaid Other | Source: Ambulatory Visit | Attending: Cardiology | Admitting: Cardiology

## 2020-09-13 DIAGNOSIS — Z20822 Contact with and (suspected) exposure to covid-19: Secondary | ICD-10-CM | POA: Insufficient documentation

## 2020-09-13 DIAGNOSIS — Z01812 Encounter for preprocedural laboratory examination: Secondary | ICD-10-CM | POA: Insufficient documentation

## 2020-09-13 LAB — SARS CORONAVIRUS 2 (TAT 6-24 HRS): SARS Coronavirus 2: NEGATIVE

## 2020-09-16 ENCOUNTER — Ambulatory Visit: Payer: Medicaid Other

## 2020-09-16 ENCOUNTER — Other Ambulatory Visit: Payer: Self-pay

## 2020-09-16 DIAGNOSIS — R0609 Other forms of dyspnea: Secondary | ICD-10-CM

## 2020-09-25 ENCOUNTER — Ambulatory Visit: Payer: Medicaid Other | Admitting: Cardiology

## 2020-09-26 ENCOUNTER — Other Ambulatory Visit: Payer: Self-pay

## 2020-09-26 ENCOUNTER — Encounter: Payer: Self-pay | Admitting: Cardiology

## 2020-09-26 ENCOUNTER — Ambulatory Visit: Payer: Medicaid Other | Admitting: Cardiology

## 2020-09-26 VITALS — BP 126/71 | HR 75 | Ht 71.0 in | Wt 181.0 lb

## 2020-09-26 DIAGNOSIS — G90A Postural orthostatic tachycardia syndrome (POTS): Secondary | ICD-10-CM

## 2020-09-26 NOTE — Progress Notes (Signed)
Patient referred by [place], Julie M, NP for dizziness, palpitations  Subjective:   Juan Barnes, male    DOB: [birth year], 29 y.o.   MRN: [id number]   Chief Complaint  Patient presents with   Chest Pain   Follow-up     HPI  29 y.o. African-American male with history of anxiety, referred for evaluation of dizziness and palpitations.  Patient is doing well, has not had any recurrent lightheadedness, chest pain symptoms.  Reviewed recent cardiac testing with the patient, details below.  Initial consultation HPI 07/2020: Patient is a truck driver by profession, drives several hours every day.  He does little in terms of physical activity or exercise outside of work.  He tells me that he had either a cardiac MRI or chest x-ray several years ago and was told to have fluid around his heart.  He has had left-sided chest pain, lasting for anywhere from few seconds to few minutes.  There is loose correlation with exertion.  More recently, is experienced exertional dyspnea.  He also reports dizziness with minimal activity, especially while standing.  He denies any syncopal episodes.    No current outpatient medications on file prior to visit.   No current facility-administered medications on file prior to visit.    Cardiovascular and other pertinent studies:  Exercise treadmill stress test 09/16/2020: Exercise treadmill stress test performed using Bruce protocol.  Patient reached 10.1 METS, and 101% of age predicted maximum heart rate.  Exercise capacity was excellent.  No chest pain reported.  Normal heart rate and hemodynamic response. Stress EKG revealed no ischemic changes. Low risk study.  Echocardiogram 09/05/2020:  Normal LV systolic function with EF 54%. Left ventricle cavity is normal  in size. Normal global wall motion. Indeterminate diastolic filling  pattern due to inadequate quantification. Probably normal diastolic  function. Calculated EF 54%.  No significant  valvular abnormalities.  Normal echocardiogram.   EKG 08/27/2020: Sinus rhythm 87 bpm Normal EKG   Echocardiogram 2017: - Normal study with the exception of a trivial pericardial effusion  noted lateral to heart.   Recent labs: 08/06/2020: Glucose 111. BUN/Cr 21/1.22. eGFR >90. Na/K 137/4.0. Rest of the CMP normal H/H 14/44. MCV 94. Platelets 167  TSH 2.0 normal   Review of Systems  Cardiovascular: Positive for chest pain and dyspnea on exertion. Negative for leg swelling, palpitations and syncope.  Neurological: Positive for dizziness and light-headedness.         Vitals:   09/26/20 1103  BP: 126/71  Pulse: 75  SpO2: 98%     Body mass index is 25.24 kg/m. Filed Weights   09/26/20 1103  Weight: 181 lb (82.1 kg)     Objective:   Physical Exam Vitals and nursing note reviewed.  Constitutional:      General: He is not in acute distress. Neck:     Vascular: No JVD.  Cardiovascular:     Rate and Rhythm: Normal rate and regular rhythm.     Pulses: Normal pulses.     Heart sounds: Normal heart sounds. No murmur heard.   Pulmonary:     Effort: Pulmonary effort is normal.     Breath sounds: Normal breath sounds. No wheezing or rales.          Assessment & Recommendations:   29 y.o. African-American male with history of anxiety, referred for evaluation of dizziness and palpitations.  Chest pain, shortness of breath: Now resolved.  Structurally normal heart, normal exercise treadmill testing.  POTS: Encourage  increase hydration, wearing compression stockings, and regular physical activity.  Follow-up as needed   Nigel Mormon, MD Pager: [phone] Office: [phone]

## 2022-05-07 ENCOUNTER — Emergency Department (HOSPITAL_COMMUNITY)
Admission: EM | Admit: 2022-05-07 | Discharge: 2022-05-07 | Disposition: A | Discharge status: Home / Self Care | Payer: Medicaid Other | Attending: Emergency Medicine | Admitting: Emergency Medicine

## 2022-05-07 ENCOUNTER — Other Ambulatory Visit: Payer: Self-pay

## 2022-05-07 ENCOUNTER — Emergency Department (HOSPITAL_COMMUNITY): Payer: Medicaid Other

## 2022-05-07 ENCOUNTER — Encounter (HOSPITAL_COMMUNITY): Payer: Self-pay

## 2022-05-07 DIAGNOSIS — M5440 Lumbago with sciatica, unspecified side: Secondary | ICD-10-CM

## 2022-05-07 DIAGNOSIS — M5442 Lumbago with sciatica, left side: Secondary | ICD-10-CM | POA: Insufficient documentation

## 2022-05-07 DIAGNOSIS — R109 Unspecified abdominal pain: Secondary | ICD-10-CM | POA: Diagnosis not present

## 2022-05-07 DIAGNOSIS — M545 Low back pain, unspecified: Secondary | ICD-10-CM | POA: Diagnosis present

## 2022-05-07 DIAGNOSIS — M5441 Lumbago with sciatica, right side: Secondary | ICD-10-CM | POA: Diagnosis not present

## 2022-05-07 LAB — COMPREHENSIVE METABOLIC PANEL
ALT: 19 U/L (ref 0–44)
AST: 20 U/L (ref 15–41)
Albumin: 4.3 g/dL (ref 3.5–5.0)
Alkaline Phosphatase: 42 U/L (ref 38–126)
Anion gap: 8 (ref 5–15)
BUN: 21 mg/dL — ABNORMAL HIGH (ref 6–20)
CO2: 25 mmol/L (ref 22–32)
Calcium: 9.1 mg/dL (ref 8.9–10.3)
Chloride: 106 mmol/L (ref 98–111)
Creatinine, Ser: 1.11 mg/dL (ref 0.61–1.24)
GFR, Estimated: >60 mL/min (ref >60)
Glucose, Bld: 98 mg/dL (ref 70–99)
Potassium: 4.4 mmol/L (ref 3.5–5.1)
Sodium: 139 mmol/L (ref 135–145)
Total Bilirubin: 0.7 mg/dL (ref 0.3–1.2)
Total Protein: 7.4 g/dL (ref 6.5–8.1)

## 2022-05-07 LAB — CBC
HCT: 45.2 % (ref 39.0–52.0)
Hemoglobin: 15.4 g/dL (ref 13.0–17.0)
MCH: 32.4 pg (ref 26.0–34.0)
MCHC: 34.1 g/dL (ref 30.0–36.0)
MCV: 95.2 fL (ref 80.0–100.0)
Platelets: 171 10*3/uL (ref 150–400)
RBC: 4.75 MIL/uL (ref 4.22–5.81)
RDW: 12.8 % (ref 11.5–15.5)
WBC: 5.5 10*3/uL (ref 4.0–10.5)
nRBC: 0 % (ref 0.0–0.2)

## 2022-05-07 LAB — LIPASE, BLOOD: Lipase: 36 U/L (ref 11–51)

## 2022-05-07 MED ORDER — MORPHINE SULFATE (PF) 4 MG/ML IV SOLN
4.0000 mg | Freq: Once | INTRAVENOUS | Status: AC
  Administered 2022-05-07: 4 mg via INTRAVENOUS
  Filled 2022-05-07: qty 1

## 2022-05-07 MED ORDER — PREDNISONE 10 MG (21) PO TBPK: ORAL_TABLET | Freq: Every day | ORAL | 0 refills | Status: DC

## 2022-05-07 MED ORDER — OXYCODONE-ACETAMINOPHEN 5-325 MG PO TABS
1.0000 | ORAL_TABLET | Freq: Once | ORAL | Status: AC
  Administered 2022-05-07: 1 via ORAL
  Filled 2022-05-07: qty 1

## 2022-05-07 MED ORDER — KETOROLAC TROMETHAMINE 30 MG/ML IJ SOLN
30.0000 mg | Freq: Once | INTRAMUSCULAR | Status: AC
  Administered 2022-05-07: 30 mg via INTRAVENOUS
  Filled 2022-05-07: qty 1

## 2022-05-07 MED ORDER — IBUPROFEN 800 MG PO TABS: 800.0000 mg | ORAL_TABLET | Freq: Three times a day (TID) | ORAL | 0 refills | Status: DC | PRN

## 2022-05-07 MED ORDER — IOHEXOL 300 MG/ML  SOLN
100.0000 mL | Freq: Once | INTRAMUSCULAR | Status: AC | PRN
  Administered 2022-05-07: 100 mL via INTRAVENOUS

## 2022-05-07 MED ORDER — SODIUM CHLORIDE (PF) 0.9 % IJ SOLN
INTRAMUSCULAR | Status: AC
  Filled 2022-05-07: qty 50

## 2022-05-07 MED ORDER — OXYCODONE-ACETAMINOPHEN 5-325 MG PO TABS
1.0000 | ORAL_TABLET | Freq: Four times a day (QID) | ORAL | 0 refills | Status: DC | PRN
Start: 2022-05-07 — End: 2023-03-18

## 2022-05-07 NOTE — ED Triage Notes (Signed)
Patient c/o right lower back pain, right groin pain, abdominal pain and states he has numbness of the right leg since yesterday afternoon. ?Patient denies any issues with urination, but states when he has a BM he has pain in the right lower back. ?

## 2022-05-07 NOTE — ED Provider Triage Note (Signed)
Emergency Medicine Provider Triage Evaluation Note ? ?Juan Barnes , a 31 y.o. male  was evaluated in triage.  Pt complains of abdominal pain gas, nausea, heartburn, low back pain on the right with feeling of heaviness, tingling in the right leg.  Patient reports he has not had pain like this in the past, reports that he took some Gas-X, 800 mg ibuprofen with minimal relief.  He denies intra-abdominal surgeries, previous history of back surgery.  He denies any weakness or numbness of the arms, facial droop.  He reports that the symptoms started suddenly last night and persisted despite rest, treatment. ? ?Review of Systems  ?Positive: Abdominal pain, bloating, back pain, leg heaviness ?Negative: Total numbness, confusion, dysarthria, weakness ? ?Physical Exam  ?BP (!) 141/83 (BP Location: Left Arm)   Pulse 73   Temp 98 ?F (36.7 ?C) (Oral)   Resp 16   Ht 5\' 11"  (1.803 m)   Wt 83.9 kg   SpO2 99%   BMI 25.80 kg/m?  ?Gen:   Awake, no distress   ?Resp:  Normal effort  ?MSK:   Moves extremities without difficulty  ?Other:  Significant TTP abdomen, most focally LLQ, TTP midline lumbar spine. Endorses sensory discrepancy between left and right legs, right less sensations than left. No strength deficit on my exam. No facial droop. Normal coordination. No dysarthria. ? ?Medical Decision Making  ?Medically screening exam initiated at 12:23 PM.  Appropriate orders placed.  Iden Stripling was informed that the remainder of the evaluation will be completed by another provider, this initial triage assessment does not replace that evaluation, and the importance of remaining in the ED until their evaluation is complete. ? ?Workup initiated ?  ?Leandra Kern, PA-C ?05/07/22 1225 ? ?

## 2022-05-07 NOTE — Discharge Instructions (Addendum)
You were seen in the emergency department for low back pain and numbness down your right leg.  Your CAT scan showed some disc bulging and this is possible cause of your symptoms.  We are treating you with anti-inflammatory medications, steroids and pain medicine.  This medicine may make you constipated so make sure you are taking a stool softener if needed.  Contact your primary care doctor for close follow-up.  Contact neurosurgery for follow-up.  Return if any worsening or concerning symptoms ?

## 2022-05-07 NOTE — ED Provider Notes (Signed)
?[facility]-EMERGENCY DEPT ?Provider Note ? ? ?CSN: [id number] ?Arrival date & time: 05/07/22  1124 ? ?  ? ?History ? ?Chief Complaint  ?Patient presents with  ? Back Pain  ? Groin Pain  ? Abdominal Pain  ? ? ?Juan Barnes is a 31 y.o. male.  He is here with low back pain and numbness down the right posterior leg down to his ankle.  Started yesterday.  Worse with sitting and bending.  He also had some abdominal pain with it although he said it improved after taking some gas medication.  No fevers chills.  Denies any IV drug use.  No bowel or bladder incontinence.  No known trauma. ? ?The history is provided by the patient.  ?Back Pain ?Location:  Lumbar spine ?Quality:  Aching ?Pain severity:  Severe ?Pain is:  Same all the time ?Onset quality:  Gradual ?Duration:  2 days ?Timing:  Constant ?Progression:  Unchanged ?Chronicity:  New ?Context: not recent injury   ?Relieved by:  Nothing ?Worsened by:  Sitting and bending ?Ineffective treatments:  None tried ?Associated symptoms: abdominal pain and numbness   ?Associated symptoms: no bladder incontinence, no bowel incontinence, no chest pain, no dysuria, no fever and no weakness   ?Abdominal Pain ?Associated symptoms: no chest pain, no dysuria, no fever, no shortness of breath and no sore throat   ? ?  ? ?Home Medications ?Prior to Admission medications   ?Not on File  ?   ? ?Allergies    ?Banana   ? ?Review of Systems   ?Review of Systems  ?Constitutional:  Negative for fever.  ?HENT:  Negative for sore throat.   ?Respiratory:  Negative for shortness of breath.   ?Cardiovascular:  Negative for chest pain.  ?Gastrointestinal:  Positive for abdominal pain. Negative for bowel incontinence.  ?Genitourinary:  Negative for bladder incontinence and dysuria.  ?Musculoskeletal:  Positive for back pain.  ?Skin:  Negative for rash.  ?Neurological:  Positive for numbness. Negative for weakness.  ? ?Physical Exam ?Updated Vital Signs ?BP (!) 141/83 (BP  Location: Left Arm)   Pulse 73   Temp 98 ?F (36.7 ?C) (Oral)   Resp 16   Ht 5\' 11"  (1.803 m)   Wt 83.9 kg   SpO2 99%   BMI 25.80 kg/m?  ?Physical Exam ?Vitals and nursing note reviewed.  ?Constitutional:   ?   General: He is not in acute distress. ?   Appearance: Normal appearance. He is well-developed.  ?HENT:  ?   Head: Normocephalic and atraumatic.  ?Eyes:  ?   Conjunctiva/sclera: Conjunctivae normal.  ?Cardiovascular:  ?   Rate and Rhythm: Normal rate and regular rhythm.  ?   Heart sounds: No murmur heard. ?Pulmonary:  ?   Effort: Pulmonary effort is normal. No respiratory distress.  ?   Breath sounds: Normal breath sounds.  ?Abdominal:  ?   Palpations: Abdomen is soft.  ?   Tenderness: There is no abdominal tenderness. There is no guarding or rebound.  ?Musculoskeletal:     ?   General: Tenderness present. No swelling.  ?   Cervical back: Neck supple.  ?   Comments: He has some tenderness of his midline lumbar spine and paralumbar area.  ?Skin: ?   General: Skin is warm and dry.  ?   Capillary Refill: Capillary refill takes less than 2 seconds.  ?Neurological:  ?   Mental Status: He is alert.  ?   Sensory: Sensory deficit present.  ?  Motor: No weakness.  ?   Gait: Gait normal.  ?   Comments: Decreased sensation posterior right thigh and lower leg.  No weakness.  Normal patellar reflex.  ? ? ?ED Results / Procedures / Treatments   ?Labs ?(all labs ordered are listed, but only abnormal results are displayed) ?Labs Reviewed  ?COMPREHENSIVE METABOLIC PANEL - Abnormal; Notable for the following components:  ?    Result Value  ? BUN 21 (*)   ? All other components within normal limits  ?LIPASE, BLOOD  ?CBC  ?URINALYSIS, ROUTINE W REFLEX MICROSCOPIC  ? ? ?EKG ?None ? ?Radiology ?CT ABDOMEN PELVIS W CONTRAST ? ?Result Date: 05/07/2022 ?CLINICAL DATA:  Abdominal pain, right lower back and right groin pain EXAM: CT ABDOMEN AND PELVIS WITH CONTRAST TECHNIQUE: Multidetector CT imaging of the abdomen and pelvis was  performed using the standard protocol following bolus administration of intravenous contrast. RADIATION DOSE REDUCTION: This exam was performed according to the departmental dose-optimization program which includes automated exposure control, adjustment of the mA and/or kV according to patient size and/or use of iterative reconstruction technique. CONTRAST:  OMNIPAQUE IOHEXOL 300 MG/ML  SOLN COMPARISON:  07/26/2016 FINDINGS: Lower chest: No focal pulmonary opacity or pleural effusion. No pericardial effusion. Hepatobiliary: No focal hepatic lesion. The hepatic and portal veins are patent. No intra or extrahepatic biliary ductal dilatation. The gallbladder demonstrates a Phrygian cap but is otherwise unremarkable. Pancreas: Unremarkable. No pancreatic ductal dilatation or surrounding inflammatory changes. Spleen: Normal in size without focal abnormality. Adrenals/Urinary Tract: Adrenal glands are unremarkable. The kidneys enhance symmetrically with no hydronephrosis or nephrolithiasis. The bladder is unremarkable. Stomach/Bowel: Stomach is within normal limits. Appendix appears normal. No evidence of bowel wall thickening, distention, or inflammatory changes. Vascular/Lymphatic: No significant vascular findings are present. No enlarged abdominal or pelvic lymph nodes. Reproductive: Prostate is unremarkable. Other: Small fat containing right inguinal hernia. No free fluid or free air. Musculoskeletal: S shaped curvature of the thoracolumbar spine. 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 No acute osseous abnormality. IMPRESSION: 1. No acute process in the abdomen or pelvis. 2. 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 Please correlate with imaging if any intervention is planned. Electronically Signed   By: Wiliam Ke M.D.   On: 05/07/2022 14:11  ? ?CT L-SPINE NO CHARGE ? ?Result Date:  05/07/2022 ?CLINICAL DATA:  Right-sided back pain, right groin pain and numbness in the right leg. EXAM: CT LUMBAR SPINE WITHOUT CONTRAST TECHNIQUE: Multidetector CT imaging of the lumbar spine was performed without intravenous contrast administration. Multiplanar CT image reconstructions were also generated. RADIATION DOSE REDUCTION: This exam was performed according to the departmental dose-optimization program which includes automated exposure control, adjustment of the mA and/or kV according to patient size and/or use of iterative reconstruction technique. COMPARISON:  None Available. FINDINGS: Segmentation: There are five lumbar type vertebral bodies. The last full intervertebral disc space is labeled L5-S1. Alignment: Normal Vertebrae: No bone lesions or fractures. Paraspinal and other soft tissues: No significant paraspinal or retroperitoneal findings. Disc levels: No lumbar disc protrusions, spinal or foraminal stenosis. Mild diffuse annular bulge at L4-5. Mild degenerative disc disease at L5-S1 with mild disc space narrowing. The facets are normally aligned. No pars defects. IMPRESSION: 1. Mild diffuse annular bulge at L4-5. 2. Mild degenerative disc disease at L5-S1 with mild disc space narrowing. 3. No lumbar disc protrusions, spinal or foraminal stenosis.  Electronically Signed   By: Rudie MeyerP.  Gallerani M.D.   On: 05/07/2022 14:11   ? ?Procedures ?Procedures  ? ? ?Medications Ordered in ED ?Medications  ?sodium chloride (PF) 0.9 % injection (has no administration in time range)  ?oxyCODONE-acetaminophen (PERCOCET/ROXICET) 5-325 MG per tablet 1 tablet (1 tablet Oral Given 05/07/22 1236)  ?ketorolac (TORADOL) 30 MG/ML injection 30 mg (30 mg Intravenous Given 05/07/22 1311)  ?morphine (PF) 4 MG/ML injection 4 mg (4 mg Intravenous Given 05/07/22 1312)  ?iohexol (OMNIPAQUE) 300 MG/ML solution 100 mL (100 mLs Intravenous Contrast Given 05/07/22 1351)  ? ? ?ED Course/ Medical Decision Making/ A&P ?  ?                         ?Medical Decision Making ?Amount and/or Complexity of Data Reviewed ?Labs: ordered. ? ?Risk ?Prescription drug management. ? ?This patient complains of back pain and right leg numbness; this involves an extensive number of

## 2023-03-18 ENCOUNTER — Ambulatory Visit
Admission: EM | Admit: 2023-03-18 | Discharge: 2023-03-18 | Disposition: A | Discharge status: Home / Self Care | Payer: Medicaid Other | Attending: Family Medicine | Admitting: Family Medicine

## 2023-03-18 DIAGNOSIS — G44209 Tension-type headache, unspecified, not intractable: Secondary | ICD-10-CM | POA: Diagnosis not present

## 2023-03-18 DIAGNOSIS — M542 Cervicalgia: Secondary | ICD-10-CM | POA: Diagnosis not present

## 2023-03-18 MED ORDER — CYCLOBENZAPRINE HCL 10 MG PO TABS: 10.0000 mg | ORAL_TABLET | Freq: Two times a day (BID) | ORAL | 0 refills | Status: DC | PRN

## 2023-03-18 MED ORDER — KETOROLAC TROMETHAMINE 30 MG/ML IJ SOLN
30.0000 mg | Freq: Once | INTRAMUSCULAR | Status: AC
  Administered 2023-03-18: 30 mg via INTRAMUSCULAR

## 2023-03-18 MED ORDER — IBUPROFEN 800 MG PO TABS: 800.0000 mg | ORAL_TABLET | Freq: Three times a day (TID) | ORAL | 0 refills | Status: DC | PRN

## 2023-03-18 NOTE — ED Provider Notes (Signed)
[name]    CSN: [id number] Arrival date & time: 03/18/23  1808      History   Chief Complaint Chief Complaint  Patient presents with   Neck Pain   Headache    HPI Juan Barnes is a 32 y.o. male.   HPI  Medical chart is reviewed.  Patient has a history of quite a few medical problems including POTS, shortness of breath, dizziness, migraines, panic attacks and anxiety disorder although he is really not bothered by any of these conditions and states that he generally stays very healthy.  He is a Administrator.  He states he woke up 4 days ago with some stiffness in his neck.  His wife states he slept in an odd position.  Since that time his neck pain has gotten worse and he is developed a headache.  The neck pain and headache have not gone away, he is taking Motrin as needed.  Headache pain today is a 9/10.  He has had no recent illness or fever.  He had no trauma.  No photophobia, no nausea or vomiting.  This does not feel like a migraine.  He states has had meningitis in the past and this does not feel like meningitis.  Past Medical History:  Diagnosis Date   Anxiety attack    Chicken pox    Common migraine with intractable migraine 07/29/2017   Pollen allergy    Tachycardia, paroxysmal [facility])     Patient Active Problem List   Diagnosis Date Noted   Shortness of breath 08/27/2020   POTS (postural orthostatic tachycardia syndrome) 08/27/2020   Common migraine with intractable migraine 07/29/2017   Dizziness 04/01/2017   Suicidal thoughts 01/21/2017   Panic attacks 09/02/2016   Pericardial effusion 07/28/2016   Generalized anxiety disorder 07/28/2016   Tachycardia, paroxysmal ([place]) 07/28/2016    Past Surgical History:  Procedure Laterality Date   NO PAST SURGERIES         Home Medications    Prior to Admission medications   Medication Sig Start Date End Date Taking? Authorizing Provider  cyclobenzaprine (FLEXERIL) 10 MG tablet Take 1 tablet (10 mg  total) by mouth 2 (two) times daily as needed for muscle spasms. 03/18/23  Yes Raylene Everts, MD  ibuprofen (ADVIL) 800 MG tablet Take 1 tablet (800 mg total) by mouth every 8 (eight) hours as needed. 03/18/23   Raylene Everts, MD    Family History Family History  Problem Relation Age of Onset   Prostate cancer Father        59   Post-traumatic stress disorder Father    Anxiety disorder Father    Asthma Brother     Social History Social History   Tobacco Use   Smoking status: Former    Types: Cigarettes    Quit date: 05/29/2015    Years since quitting: 7.8   Smokeless tobacco: Never  Vaping Use   Vaping Use: Never used  Substance Use Topics   Alcohol use: No   Drug use: Not Currently    Types: Marijuana     Allergies   Banana   Review of Systems Review of Systems  See HPI Physical Exam Triage Vital Signs ED Triage Vitals  Enc Vitals Group     BP 03/18/23 1818 (!) 151/93     Pulse Rate 03/18/23 1818 66     Resp 03/18/23 1818 17     Temp 03/18/23 1818 98.3 F (36.8 C)  Temp Source 03/18/23 1818 Oral     SpO2 03/18/23 1818 99 %     Weight --      Height --      Head Circumference --      Peak Flow --      Pain Score 03/18/23 1819 9     Pain Loc --      Pain Edu? --      Excl. in [place]? --    No data found.  Updated Vital Signs BP (!) 151/93 (BP Location: Right Arm)   Pulse 66   Temp 98.3 F (36.8 C) (Oral)   Resp 17   SpO2 99%       Physical Exam Constitutional:      General: He is not in acute distress.    Appearance: He is well-developed and normal weight. He is ill-appearing.     Comments: Stiff and guarded movements, appears uncomfortable  HENT:     Head: Normocephalic and atraumatic.     Mouth/Throat:     Mouth: Mucous membranes are moist.  Eyes:     General: No visual field deficit.    Extraocular Movements: Extraocular movements intact.     Right eye: No nystagmus.     Left eye: No nystagmus.     Conjunctiva/sclera:  Conjunctivae normal.     Pupils: Pupils are equal, round, and reactive to light.     Right eye: Pupil is round and reactive.     Left eye: Pupil is reactive.  Neck:     Comments: Tenderness of the paraspinous cervical muscles near the occiput Cardiovascular:     Rate and Rhythm: Normal rate and regular rhythm.     Heart sounds: Normal heart sounds.  Pulmonary:     Effort: Pulmonary effort is normal. No respiratory distress.     Breath sounds: Normal breath sounds.  Abdominal:     General: There is no distension.     Palpations: Abdomen is soft.  Musculoskeletal:        General: Normal range of motion.     Cervical back: Normal range of motion. No rigidity.  Skin:    General: Skin is warm and dry.  Neurological:     Mental Status: He is alert. Mental status is at baseline.     Cranial Nerves: No cranial nerve deficit, dysarthria or facial asymmetry.     Motor: No weakness.     Coordination: Coordination normal.     Gait: Gait normal.      UC Treatments / Results  Labs (all labs ordered are listed, but only abnormal results are displayed) Labs Reviewed - No data to display  EKG   Radiology No results found.  Procedures Procedures (including critical care time)  Medications Ordered in UC Medications  ketorolac (TORADOL) 30 MG/ML injection 30 mg (30 mg Intramuscular Given 03/18/23 1837)    Initial Impression / Assessment and Plan / UC Course  I have reviewed the triage vital signs and the nursing notes.  Pertinent labs & imaging results that were available during my care of the patient were reviewed by me and considered in my medical decision making (see chart for details).     This appears to be a muscle tension headache.  Intractable.  Given a shot of Toradol for pain.  Medication for home. Final Clinical Impressions(s) / UC Diagnoses   Final diagnoses:  Muscle tension headache  Neck pain, musculoskeletal     Discharge Instructions  Take Flexeril  at night.  This is a muscle relaxer.  May take during the day if needed.  It may cause drowsiness Ibuprofen 3 times a day with food See your primary care doctor in follow-up May need to go to the emergency room if worse, if stronger pain management needed     ED Prescriptions     Medication Sig Dispense Auth. Provider   cyclobenzaprine (FLEXERIL) 10 MG tablet Take 1 tablet (10 mg total) by mouth 2 (two) times daily as needed for muscle spasms. 20 tablet Raylene Everts, MD   ibuprofen (ADVIL) 800 MG tablet Take 1 tablet (800 mg total) by mouth every 8 (eight) hours as needed. 21 tablet Raylene Everts, MD      PDMP not reviewed this encounter.   Raylene Everts, MD 03/18/23 [phone]

## 2023-03-18 NOTE — ED Triage Notes (Signed)
Pt c/o neck pain and headache after waking up 4 days ago. Denies injury. Motrin prn. Pain 9/10

## 2023-03-18 NOTE — Discharge Instructions (Addendum)
Take Flexeril at night.  This is a muscle relaxer.  May take during the day if needed.  It may cause drowsiness Ibuprofen 3 times a day with food See your primary care doctor in follow-up May need to go to the emergency room if worse, if stronger pain management needed

## 2023-05-18 ENCOUNTER — Encounter (HOSPITAL_COMMUNITY): Payer: Self-pay

## 2023-05-18 ENCOUNTER — Emergency Department (HOSPITAL_COMMUNITY): Payer: Medicaid Other

## 2023-05-18 ENCOUNTER — Other Ambulatory Visit: Payer: Self-pay

## 2023-05-18 ENCOUNTER — Emergency Department (HOSPITAL_COMMUNITY)
Admission: EM | Admit: 2023-05-18 | Discharge: 2023-05-18 | Disposition: A | Discharge status: Home / Self Care | Payer: Medicaid Other | Attending: Emergency Medicine | Admitting: Emergency Medicine

## 2023-05-18 DIAGNOSIS — E860 Dehydration: Secondary | ICD-10-CM | POA: Diagnosis not present

## 2023-05-18 DIAGNOSIS — R1013 Epigastric pain: Secondary | ICD-10-CM | POA: Insufficient documentation

## 2023-05-18 DIAGNOSIS — R1010 Upper abdominal pain, unspecified: Secondary | ICD-10-CM | POA: Insufficient documentation

## 2023-05-18 DIAGNOSIS — R109 Unspecified abdominal pain: Secondary | ICD-10-CM | POA: Diagnosis present

## 2023-05-18 HISTORY — DX: Postural orthostatic tachycardia syndrome (POTS): G90.A

## 2023-05-18 LAB — URINALYSIS, ROUTINE W REFLEX MICROSCOPIC
Bilirubin Urine: NEGATIVE
Glucose, UA: NEGATIVE mg/dL
Hgb urine dipstick: NEGATIVE
Ketones, ur: 20 mg/dL — AB
Leukocytes,Ua: NEGATIVE
Nitrite: NEGATIVE
Protein, ur: NEGATIVE mg/dL
Specific Gravity, Urine: 1.024 (ref 1.005–1.030)
pH: 5 (ref 5.0–8.0)

## 2023-05-18 LAB — COMPREHENSIVE METABOLIC PANEL
ALT: 23 U/L (ref 0–44)
AST: 21 U/L (ref 15–41)
Albumin: 4.6 g/dL (ref 3.5–5.0)
Alkaline Phosphatase: 48 U/L (ref 38–126)
Anion gap: 10 (ref 5–15)
BUN: 17 mg/dL (ref 6–20)
CO2: 23 mmol/L (ref 22–32)
Calcium: 9.1 mg/dL (ref 8.9–10.3)
Chloride: 103 mmol/L (ref 98–111)
Creatinine, Ser: 1.27 mg/dL — ABNORMAL HIGH (ref 0.61–1.24)
GFR, Estimated: >60 mL/min (ref >60)
Glucose, Bld: 84 mg/dL (ref 70–99)
Potassium: 3.9 mmol/L (ref 3.5–5.1)
Sodium: 136 mmol/L (ref 135–145)
Total Bilirubin: 1.3 mg/dL — ABNORMAL HIGH (ref 0.3–1.2)
Total Protein: 8.1 g/dL (ref 6.5–8.1)

## 2023-05-18 LAB — CBC
HCT: 47.5 % (ref 39.0–52.0)
Hemoglobin: 15.9 g/dL (ref 13.0–17.0)
MCH: 31.2 pg (ref 26.0–34.0)
MCHC: 33.5 g/dL (ref 30.0–36.0)
MCV: 93.1 fL (ref 80.0–100.0)
Platelets: 186 10*3/uL (ref 150–400)
RBC: 5.1 MIL/uL (ref 4.22–5.81)
RDW: 12.4 % (ref 11.5–15.5)
WBC: 4.1 10*3/uL (ref 4.0–10.5)
nRBC: 0 % (ref 0.0–0.2)

## 2023-05-18 LAB — LIPASE, BLOOD: Lipase: 30 U/L (ref 11–51)

## 2023-05-18 MED ORDER — SODIUM CHLORIDE 0.9 % IV BOLUS
1000.0000 mL | Freq: Once | INTRAVENOUS | Status: AC
  Administered 2023-05-18: 1000 mL via INTRAVENOUS

## 2023-05-18 MED ORDER — DICYCLOMINE HCL 20 MG PO TABS: 20.0000 mg | ORAL_TABLET | Freq: Two times a day (BID) | ORAL | 0 refills | Status: DC

## 2023-05-18 MED ORDER — SUCRALFATE 1 G PO TABS: 1.0000 g | ORAL_TABLET | Freq: Three times a day (TID) | ORAL | 0 refills | Status: DC

## 2023-05-18 MED ORDER — ALUM & MAG HYDROXIDE-SIMETH 200-200-20 MG/5ML PO SUSP
30.0000 mL | Freq: Once | ORAL | Status: AC
  Administered 2023-05-18: 30 mL via ORAL
  Filled 2023-05-18: qty 30

## 2023-05-18 NOTE — ED Triage Notes (Signed)
C/o generalized abd pain and frequent belching after eating  x4 days.  Tums w/o relief.  Pt reports hx of GERD and on omeprazole.

## 2023-05-18 NOTE — ED Provider Notes (Signed)
[place] EMERGENCY DEPARTMENT AT [facility] Provider Note   CSN: [id number] Arrival date & time: 05/18/23  1447     History  Chief Complaint  Patient presents with   Abdominal Pain    Juan Barnes is a 32 y.o. male.  The history is provided by the patient and medical records. No language interpreter was used.  Abdominal Pain    33 year old male with significant history of GERD, generalized anxiety disorder, POTS presenting with complaint of abdominal pain.  Patient report for the past 3 to 4 days he has had progressive worsening abdominal pain.  Pain is primarily to his right side abdomen, crampy achy, worse after eating.  States he is afraid to eat and does not have much of an appetite.  Denies any fever chills chest pain shortness of breath productive cough back pain dysuria nausea vomiting diarrhea.  Denies alcohol tobacco use denies marijuana use.  He tries taking Tums, and prilosec without relief.  Rates pain as 8 out of 10.  Home Medications Prior to Admission medications   Medication Sig Start Date End Date Taking? Authorizing Provider  cyclobenzaprine (FLEXERIL) 10 MG tablet Take 1 tablet (10 mg total) by mouth 2 (two) times daily as needed for muscle spasms. 03/18/23   Eustace Moore, MD  ibuprofen (ADVIL) 800 MG tablet Take 1 tablet (800 mg total) by mouth every 8 (eight) hours as needed. 03/18/23   Eustace Moore, MD      Allergies    Banana    Review of Systems   Review of Systems  Gastrointestinal:  Positive for abdominal pain.  All other systems reviewed and are negative.   Physical Exam Updated Vital Signs BP (!) 144/79 (BP Location: Right Arm)   Pulse 76   Temp 98.4 F (36.9 C) (Oral)   Resp 16   Wt 83 kg   SpO2 98%   BMI 25.52 kg/m  Physical Exam Vitals and nursing note reviewed.  Constitutional:      General: He is not in acute distress.    Appearance: He is well-developed.  HENT:     Head: Atraumatic.  Eyes:      Conjunctiva/sclera: Conjunctivae normal.  Cardiovascular:     Rate and Rhythm: Normal rate and regular rhythm.     Heart sounds: Normal heart sounds.  Pulmonary:     Effort: Pulmonary effort is normal.     Breath sounds: Normal breath sounds. No wheezing, rhonchi or rales.  Abdominal:     General: Abdomen is flat. Bowel sounds are normal.     Palpations: Abdomen is soft.     Tenderness: There is abdominal tenderness in the epigastric area. There is no guarding or rebound. Negative signs include Murphy's sign, Rovsing's sign and McBurney's sign.     Hernia: No hernia is present.  Musculoskeletal:     Cervical back: Neck supple.  Skin:    Findings: No rash.  Neurological:     Mental Status: He is alert.     ED Results / Procedures / Treatments   Labs (all labs ordered are listed, but only abnormal results are displayed) Labs Reviewed  COMPREHENSIVE METABOLIC PANEL - Abnormal; Notable for the following components:      Result Value   Creatinine, Ser 1.27 (*)    Total Bilirubin 1.3 (*)    All other components within normal limits  URINALYSIS, ROUTINE W REFLEX MICROSCOPIC - Abnormal; Notable for the following components:   Ketones, ur 20 (*)  All other components within normal limits  LIPASE, BLOOD  CBC    EKG None  Radiology US Abdomen Limited  Result Date: 05/18/2023 CLINICAL DATA:  Right upper quadrant abdominal pain for 5 days. EXAM: ULTRASOUND ABDOMEN LIMITED RIGHT UPPER QUADRANT COMPARISON:  Abdominopelvic CT 05/07/2022. FINDINGS: Gallbladder: No gallstones or wall thickening visualized. No sonographic Murphy sign noted by sonographer. Common bile duct: Diameter: 2 mm.  No intrahepatic biliary dilatation. Liver: No focal lesion identified. Within normal limits in parenchymal echogenicity. Portal vein is patent on color Doppler imaging with normal direction of blood flow towards the liver. Other: Trace perihepatic ascites. Possible trace right pleural effusion.  IMPRESSION: 1. Trace perihepatic ascites. Possible trace right pleural effusion. 2. Otherwise unremarkable right upper quadrant sonogram. Electronically Signed   By: Carey Bullocks M.D.   On: 05/18/2023 16:36    Procedures Procedures    Medications Ordered in ED Medications  alum & mag hydroxide-simeth (MAALOX/MYLANTA) 200-200-20 MG/5ML suspension 30 mL (30 mLs Oral Given 05/18/23 1605)  sodium chloride 0.9 % bolus 1,000 mL (1,000 mLs Intravenous New Bag/Given 05/18/23 1740)    ED Course/ Medical Decision Making/ A&P                             Medical Decision Making Amount and/or Complexity of Data Reviewed Labs: ordered. Radiology: ordered.  Risk OTC drugs.   BP (!) 144/79 (BP Location: Right Arm)   Pulse 76   Temp 98.4 F (36.9 C) (Oral)   Resp 16   Wt 83 kg   SpO2 98%   BMI 25.52 kg/m   48:33 PM 32 year old male with significant history of GERD, pure anxiety disorder, POTS presenting with complaint of abdominal pain.  Patient report for the past 3 to 4 days he has had progressive worsening abdominal pain.  Pain is primarily to his right side abdomen, crampy achy, worse after eating.  States he is afraid to eat and does not have much of an appetite.  Denies any fever chills chest pain shortness of breath productive cough back pain dysuria nausea vomiting diarrhea.  Denies alcohol tobacco use denies marijuana use.  He tries taking Tums, and prilosec without relief.  Rates pain as 8 out of 10.  On exam this is a well-appearing male resting comfortably in bed appears to be in no acute discomfort.  Heart with normal rate and rhythm, lungs clear to auscultation bilaterally abdomen is soft nontender with passive lower epigastric tenderness but no guarding or rebound tenderness.  Negative Murphy sign, no pain at McBurney's point.    Vitals are notable for mildly elevated blood pressure of 144/79.  Patient is afebrile no hypoxia.  -Labs ordered, independently viewed and  interpreted by me.  Labs remarkable for Cr 1.27 and UA showing 20 ketone suggesting mild dehydration.  IVF given. Normal lipase, normal WBC and H&H.  -The patient was maintained on a cardiac monitor.  I personally viewed and interpreted the cardiac monitored which showed an underlying rhythm of: NSR -Imaging independently viewed and interpreted by me and I agree with radiologist's interpretation.  Result remarkable for limited abd US showing no gallbladder problem.  Trace perihepatic ascites and possible trace right pleural effusion -This patient presents to the ED for concern of abd pain, this involves an extensive number of treatment options, and is a complaint that carries with it a high risk of complications and morbidity.  The differential diagnosis includes GERD, gastritis, cholecystitis, pancreatitis,  colitis, diverticulitis, kidney stone, pneumonia -Co morbidities that complicate the patient evaluation includes GERD -Treatment includes GI cocktail and IVF  -Reevaluation of the patient after these medicines showed that the patient improved -PCP office notes or outside notes reviewed -Escalation to admission/observation considered: patients feels much better, is comfortable with discharge, and will follow up with PCP -Prescription medication considered, patient comfortable with bentyl and sucrafate -Social Determinant of Health considered which includes tobacco use         Final Clinical Impression(s) / ED Diagnoses Final diagnoses:  Upper abdominal pain    Rx / DC Orders ED Discharge Orders          Ordered    dicyclomine (BENTYL) 20 MG tablet  2 times daily        05/18/23 1854    sucralfate (CARAFATE) 1 g tablet  3 times daily with meals & bedtime        05/18/23 1854              Fayrene Helper, PA-C 05/18/23 1857    Cathren Laine, MD 05/19/23 1336

## 2023-05-18 NOTE — Discharge Instructions (Signed)
You have been evaluated for your abdominal pain.  Your labs indicate that you are mildly dehydrated.  Please drink plenty of fluid.  Try medication prescribed to help ease with your stomach discomfort and and follow-up closely with your doctor for further care.  Return if you have any concern.

## 2024-12-18 ENCOUNTER — Other Ambulatory Visit: Payer: Self-pay

## 2024-12-18 ENCOUNTER — Ambulatory Visit
Admission: EM | Admit: 2024-12-18 | Discharge: 2024-12-18 | Disposition: A | Discharge status: Home / Self Care | Attending: Family Medicine | Admitting: Family Medicine

## 2024-12-18 DIAGNOSIS — S91332A Puncture wound without foreign body, left foot, initial encounter: Secondary | ICD-10-CM | POA: Diagnosis not present

## 2024-12-18 DIAGNOSIS — M79672 Pain in left foot: Secondary | ICD-10-CM | POA: Diagnosis not present

## 2024-12-18 MED ORDER — IBUPROFEN 800 MG PO TABS: 800.0000 mg | ORAL_TABLET | Freq: Three times a day (TID) | ORAL | 0 refills | Status: AC

## 2024-12-18 MED ORDER — CEPHALEXIN 500 MG PO CAPS: 500.0000 mg | ORAL_CAPSULE | Freq: Two times a day (BID) | ORAL | 0 refills | Status: AC

## 2024-12-18 NOTE — ED Provider Notes (Signed)
 " [facility]    CSN: [id number] Arrival date & time: 12/18/24  1433      History   Chief Complaint Chief Complaint  Patient presents with   Foot Pain    HPI Juan Barnes is a 33 y.o. male.   HPI  Patient stepped on scissors and has a puncture wound on his foot.  Still bleeding.  A couple hours ago.  He needs a note for work  Past Medical History:  Diagnosis Date   Anxiety attack    Chicken pox    Common migraine with intractable migraine 07/29/2017   Pollen allergy    POTS (postural orthostatic tachycardia syndrome)    Tachycardia, paroxysmal (HCC)     Patient Active Problem List   Diagnosis Date Noted   Shortness of breath 08/27/2020   POTS (postural orthostatic tachycardia syndrome) 08/27/2020   Common migraine with intractable migraine 07/29/2017   Dizziness 04/01/2017   Suicidal thoughts 01/21/2017   Panic attacks 09/02/2016   Pericardial effusion 07/28/2016   Generalized anxiety disorder 07/28/2016   Tachycardia, paroxysmal (HCC) 07/28/2016    Past Surgical History:  Procedure Laterality Date   NO PAST SURGERIES         Home Medications    Prior to Admission medications  Medication Sig Start Date End Date Taking? Authorizing Provider  cephALEXin  (KEFLEX ) 500 MG capsule Take 1 capsule (500 mg total) by mouth 2 (two) times daily. 12/18/24  Yes Maranda Jamee Jacob, MD  ibuprofen  (ADVIL ) 800 MG tablet Take 1 tablet (800 mg total) by mouth 3 (three) times daily. 12/18/24  Yes Maranda Jamee Jacob, MD    Family History Family History  Problem Relation Age of Onset   Prostate cancer Father        66   Post-traumatic stress disorder Father    Anxiety disorder Father    Asthma Brother     Social History Social History[1]   Allergies   Banana   Review of Systems Review of Systems  See HPI Physical Exam Triage Vital Signs ED Triage Vitals  Encounter Vitals Group     BP 12/18/24 1536 (!) 162/99     Girls Systolic BP  Percentile --      Girls Diastolic BP Percentile --      Boys Systolic BP Percentile --      Boys Diastolic BP Percentile --      Pulse Rate 12/18/24 1536 97     Resp 12/18/24 1536 16     Temp 12/18/24 1536 98 F (36.7 C)     Temp src --      SpO2 12/18/24 1536 99 %     Weight --      Height --      Head Circumference --      Peak Flow --      Pain Score 12/18/24 1538 9     Pain Loc --      Pain Education --      Exclude from Growth Chart --    No data found.  Updated Vital Signs BP (!) 162/99   Pulse 97   Temp 98 F (36.7 C)   Resp 16   SpO2 99%       Physical Exam Constitutional:      General: He is not in acute distress.    Appearance: He is well-developed.  HENT:     Head: Normocephalic and atraumatic.  Eyes:     Conjunctiva/sclera: Conjunctivae normal.  Pupils: Pupils are equal, round, and reactive to light.  Cardiovascular:     Rate and Rhythm: Normal rate.  Pulmonary:     Effort: Pulmonary effort is normal. No respiratory distress.  Musculoskeletal:        General: Normal range of motion.     Cervical back: Normal range of motion.       Feet:  Feet:     Comments: 11 mm puncture wound.  Bleeding slightly. Skin:    General: Skin is warm and dry.  Neurological:     Mental Status: He is alert.      UC Treatments / Results  Labs (all labs ordered are listed, but only abnormal results are displayed) Labs Reviewed - No data to display  EKG   Radiology No results found.  Procedures Procedures (including critical care time)  Medications Ordered in UC Medications - No data to display  Initial Impression / Assessment and Plan / UC Course  I have reviewed the triage vital signs and the nursing notes.  Pertinent labs & imaging results that were available during my care of the patient were reviewed by me and considered in my medical decision making (see chart for details).     Wound is cleansed.  Bacitracin  ointment and Band-Aid.  Wound  care discussed Final Clinical Impressions(s) / UC Diagnoses   Final diagnoses:  Foot pain, left  Puncture wound of plantar aspect of left foot, initial encounter     Discharge Instructions      Take the Keflex  2 times a day.  This is an antibiotic to prevent infection. Antibiotic ointment and a Band-Aid until the wound closes up Take ibuprofen  3 times a day as needed for pain Call for problems   ED Prescriptions     Medication Sig Dispense Auth. Provider   cephALEXin  (KEFLEX ) 500 MG capsule Take 1 capsule (500 mg total) by mouth 2 (two) times daily. 10 capsule Maranda Jamee Jacob, MD   ibuprofen  (ADVIL ) 800 MG tablet Take 1 tablet (800 mg total) by mouth 3 (three) times daily. 21 tablet Maranda Jamee Jacob, MD      PDMP not reviewed this encounter.    [1]  Social History Tobacco Use   Smoking status: Former    Current packs/day: 0.00    Types: Cigarettes    Quit date: 05/29/2015    Years since quitting: 9.5   Smokeless tobacco: Never  Vaping Use   Vaping status: Never Used  Substance Use Topics   Alcohol use: No   Drug use: Not Currently    Types: Marijuana     Maranda Jamee Jacob, MD 12/18/24 1709  "

## 2024-12-18 NOTE — ED Triage Notes (Signed)
 C/o puncture wound to left foot at home with scissors (stepped on the). Wound is to the bottom of the foot. Happened about 2 hours ago. No otc meds.

## 2024-12-18 NOTE — Discharge Instructions (Signed)
 Take the Keflex  2 times a day.  This is an antibiotic to prevent infection. Antibiotic ointment and a Band-Aid until the wound closes up Take ibuprofen  3 times a day as needed for pain Call for problems
